# Patient Record
Sex: Male | Born: 1990 | Race: White | Hispanic: No | Marital: Married | Smoking: Never smoker
Health system: Southern US, Community
[De-identification: ages and names within clinical notes are randomized; demographics above are authoritative.]

## PROBLEM LIST (undated history)

## (undated) DIAGNOSIS — R319 Hematuria, unspecified: Secondary | ICD-10-CM

## (undated) DIAGNOSIS — L709 Acne, unspecified: Secondary | ICD-10-CM

## (undated) DIAGNOSIS — T7840XA Allergy, unspecified, initial encounter: Secondary | ICD-10-CM

## (undated) DIAGNOSIS — R748 Abnormal levels of other serum enzymes: Secondary | ICD-10-CM

## (undated) DIAGNOSIS — K219 Gastro-esophageal reflux disease without esophagitis: Secondary | ICD-10-CM

## (undated) DIAGNOSIS — R519 Headache, unspecified: Secondary | ICD-10-CM

## (undated) HISTORY — DX: Headache, unspecified: R51.9

## (undated) HISTORY — DX: Allergy, unspecified, initial encounter: T78.40XA

---
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
## 2010-12-09 ENCOUNTER — Ambulatory Visit (HOSPITAL_COMMUNITY)
Admission: RE | Admit: 2010-12-09 | Discharge: 2010-12-09 | Disposition: A | Discharge status: Home / Self Care | Payer: BC Managed Care – PPO | Source: Ambulatory Visit | Attending: Vascular Surgery | Admitting: Vascular Surgery

## 2010-12-09 DIAGNOSIS — M7989 Other specified soft tissue disorders: Secondary | ICD-10-CM

## 2011-09-13 HISTORY — PX: COLONOSCOPY: SHX174

## 2011-10-13 ENCOUNTER — Other Ambulatory Visit: Payer: Self-pay | Admitting: Urology

## 2011-10-20 ENCOUNTER — Other Ambulatory Visit (HOSPITAL_COMMUNITY): Payer: Self-pay | Admitting: Urology

## 2011-10-20 DIAGNOSIS — R3129 Other microscopic hematuria: Secondary | ICD-10-CM

## 2011-10-20 DIAGNOSIS — M545 Low back pain, unspecified: Secondary | ICD-10-CM

## 2011-10-25 ENCOUNTER — Other Ambulatory Visit (HOSPITAL_COMMUNITY): Payer: BC Managed Care – PPO

## 2011-10-27 NOTE — Progress Notes (Signed)
PT STATES POSSIBLE CANCEL OR RESCHEDULE DUE TO INSURANCE ISSUE, WILL KNOW BE Monday 10-30-2011 AFTERNOON. WILL CALL PT BACK THEN UNLESS THE CASE BEEN MOVED OR CANCELLED.

## 2011-10-30 ENCOUNTER — Other Ambulatory Visit (HOSPITAL_COMMUNITY): Payer: Self-pay | Admitting: Urology

## 2011-10-30 DIAGNOSIS — M545 Low back pain, unspecified: Secondary | ICD-10-CM

## 2011-10-31 ENCOUNTER — Encounter (HOSPITAL_BASED_OUTPATIENT_CLINIC_OR_DEPARTMENT_OTHER): Payer: Self-pay | Admitting: *Deleted

## 2011-10-31 NOTE — Progress Notes (Addendum)
NPO AFTER MN. ARRIVES AT 0830. NEEDS HG.  VERIFY NAME OF REFLUX MED.  , HE WILL TAKE AM OF SURG W/ SIP OF WATER.

## 2011-11-01 ENCOUNTER — Ambulatory Visit (HOSPITAL_COMMUNITY)
Admission: RE | Admit: 2011-11-01 | Discharge: 2011-11-01 | Disposition: A | Discharge status: Home / Self Care | Payer: 59 | Source: Ambulatory Visit | Attending: Urology | Admitting: Urology

## 2011-11-01 ENCOUNTER — Other Ambulatory Visit (HOSPITAL_COMMUNITY): Payer: BC Managed Care – PPO

## 2011-11-01 DIAGNOSIS — R3129 Other microscopic hematuria: Secondary | ICD-10-CM | POA: Insufficient documentation

## 2011-11-01 DIAGNOSIS — M545 Low back pain, unspecified: Secondary | ICD-10-CM | POA: Insufficient documentation

## 2011-11-01 MED ORDER — TECHNETIUM TC 99M MERTIATIDE
15.0000 | Freq: Once | INTRAVENOUS | Status: AC | PRN
  Administered 2011-11-01: 15 via INTRAVENOUS

## 2011-11-01 MED ORDER — FUROSEMIDE 10 MG/ML IJ SOLN
38.5000 mg | Freq: Once | INTRAMUSCULAR | Status: AC
  Administered 2011-11-01: 39 mg via INTRAVENOUS

## 2011-11-01 MED ORDER — FUROSEMIDE 10 MG/ML IJ SOLN
INTRAMUSCULAR | Status: AC
  Administered 2011-11-01: 39 mg via INTRAVENOUS
  Filled 2011-11-01: qty 4

## 2011-11-02 ENCOUNTER — Encounter (HOSPITAL_BASED_OUTPATIENT_CLINIC_OR_DEPARTMENT_OTHER)
Admission: RE | Disposition: A | Discharge status: Home / Self Care | Payer: Self-pay | Source: Ambulatory Visit | Attending: Urology

## 2011-11-02 ENCOUNTER — Ambulatory Visit (HOSPITAL_BASED_OUTPATIENT_CLINIC_OR_DEPARTMENT_OTHER): Payer: 59 | Admitting: Anesthesiology

## 2011-11-02 ENCOUNTER — Ambulatory Visit (HOSPITAL_BASED_OUTPATIENT_CLINIC_OR_DEPARTMENT_OTHER)
Admission: RE | Admit: 2011-11-02 | Discharge: 2011-11-02 | Disposition: A | Discharge status: Home / Self Care | Payer: 59 | Source: Ambulatory Visit | Attending: Urology | Admitting: Urology

## 2011-11-02 ENCOUNTER — Encounter (HOSPITAL_BASED_OUTPATIENT_CLINIC_OR_DEPARTMENT_OTHER): Payer: Self-pay | Admitting: *Deleted

## 2011-11-02 ENCOUNTER — Encounter (HOSPITAL_BASED_OUTPATIENT_CLINIC_OR_DEPARTMENT_OTHER): Payer: Self-pay | Admitting: Anesthesiology

## 2011-11-02 DIAGNOSIS — M545 Low back pain, unspecified: Secondary | ICD-10-CM | POA: Insufficient documentation

## 2011-11-02 DIAGNOSIS — R310 Gross hematuria: Secondary | ICD-10-CM | POA: Insufficient documentation

## 2011-11-02 DIAGNOSIS — Z79899 Other long term (current) drug therapy: Secondary | ICD-10-CM | POA: Insufficient documentation

## 2011-11-02 DIAGNOSIS — G8929 Other chronic pain: Secondary | ICD-10-CM | POA: Insufficient documentation

## 2011-11-02 DIAGNOSIS — K219 Gastro-esophageal reflux disease without esophagitis: Secondary | ICD-10-CM | POA: Insufficient documentation

## 2011-11-02 DIAGNOSIS — R319 Hematuria, unspecified: Secondary | ICD-10-CM

## 2011-11-02 HISTORY — DX: Acne, unspecified: L70.9

## 2011-11-02 HISTORY — PX: CYSTOSCOPY: SHX5120

## 2011-11-02 HISTORY — DX: Hematuria, unspecified: R31.9

## 2011-11-02 HISTORY — DX: Gastro-esophageal reflux disease without esophagitis: K21.9

## 2011-11-02 LAB — POCT HEMOGLOBIN-HEMACUE: Hemoglobin: 16.2 g/dL (ref 13.0–17.0)

## 2011-11-02 SURGERY — CYSTOSCOPY, FLEXIBLE
Anesthesia: General | Site: Bladder | Wound class: Clean Contaminated

## 2011-11-02 MED ORDER — LIDOCAINE HCL (CARDIAC) 20 MG/ML IV SOLN
INTRAVENOUS | Status: DC | PRN
  Administered 2011-11-02: 80 mg via INTRAVENOUS

## 2011-11-02 MED ORDER — MIDAZOLAM HCL 5 MG/5ML IJ SOLN
INTRAMUSCULAR | Status: DC | PRN
  Administered 2011-11-02: 1 mg via INTRAVENOUS

## 2011-11-02 MED ORDER — STERILE WATER FOR IRRIGATION IR SOLN
Status: DC | PRN
  Administered 2011-11-02: 3000 mL

## 2011-11-02 MED ORDER — PROPOFOL 10 MG/ML IV EMUL
INTRAVENOUS | Status: DC | PRN
  Administered 2011-11-02: 220 mg via INTRAVENOUS

## 2011-11-02 MED ORDER — PHENAZOPYRIDINE HCL 200 MG PO TABS: 200.0000 mg | ORAL_TABLET | Freq: Three times a day (TID) | ORAL | Status: AC | PRN

## 2011-11-02 MED ORDER — LIDOCAINE HCL 2 % EX GEL
CUTANEOUS | Status: DC | PRN
  Administered 2011-11-02: 1

## 2011-11-02 MED ORDER — LACTATED RINGERS IV SOLN
INTRAVENOUS | Status: DC
Start: 2011-11-02 — End: 2011-11-02
  Administered 2011-11-02 (×2): via INTRAVENOUS

## 2011-11-02 MED ORDER — DEXAMETHASONE SODIUM PHOSPHATE 4 MG/ML IJ SOLN
INTRAMUSCULAR | Status: DC | PRN
  Administered 2011-11-02: 10 mg via INTRAVENOUS

## 2011-11-02 MED ORDER — ONDANSETRON HCL 4 MG/2ML IJ SOLN
INTRAMUSCULAR | Status: DC | PRN
  Administered 2011-11-02: 4 mg via INTRAVENOUS

## 2011-11-02 MED ORDER — FENTANYL CITRATE 0.05 MG/ML IJ SOLN
INTRAMUSCULAR | Status: DC | PRN
  Administered 2011-11-02 (×2): 25 ug via INTRAVENOUS
  Administered 2011-11-02: 50 ug via INTRAVENOUS
  Administered 2011-11-02: 25 ug via INTRAVENOUS

## 2011-11-02 MED ORDER — CEFAZOLIN SODIUM 1-5 GM-% IV SOLN: 1.0000 g | INTRAVENOUS | Status: DC

## 2011-11-02 MED ORDER — FENTANYL CITRATE 0.05 MG/ML IJ SOLN: 25.0000 ug | INTRAMUSCULAR | Status: DC | PRN

## 2011-11-02 SURGICAL SUPPLY — 21 items
ADAPTER CATH URET PLST 4-6FR (CATHETERS) IMPLANT
BAG DRAIN URO-CYSTO SKYTR STRL (DRAIN) ×2 IMPLANT
BOOTIES KNEE HIGH SLOAN (MISCELLANEOUS) ×2 IMPLANT
CANISTER SUCT LVC 12 LTR MEDI- (MISCELLANEOUS) IMPLANT
CATH INTERMIT  6FR 70CM (CATHETERS) IMPLANT
CATH ROBINSON RED A/P 14FR (CATHETERS) ×2 IMPLANT
CATH URET 5FR 28IN CONE TIP (BALLOONS)
CATH URET 5FR 28IN OPEN ENDED (CATHETERS) IMPLANT
CATH URET 5FR 70CM CONE TIP (BALLOONS) IMPLANT
CLOTH BEACON ORANGE TIMEOUT ST (SAFETY) ×2 IMPLANT
DRAPE CAMERA CLOSED 9X96 (DRAPES) IMPLANT
GLOVE BIO SURGEON STRL SZ7 (GLOVE) ×2 IMPLANT
GLOVE BIO SURGEON STRL SZ7.5 (GLOVE) ×2 IMPLANT
GLOVE BIOGEL M STRL SZ7.5 (GLOVE) ×2 IMPLANT
GOWN STRL REIN XL XLG (GOWN DISPOSABLE) ×4 IMPLANT
GUIDEWIRE 0.038 PTFE COATED (WIRE) IMPLANT
GUIDEWIRE ANG ZIPWIRE 038X150 (WIRE) IMPLANT
GUIDEWIRE STR DUAL SENSOR (WIRE) IMPLANT
NS IRRIG 500ML POUR BTL (IV SOLUTION) IMPLANT
PACK CYSTOSCOPY (CUSTOM PROCEDURE TRAY) ×2 IMPLANT
WATER STERILE IRR 3000ML UROMA (IV SOLUTION) ×2 IMPLANT

## 2011-11-02 NOTE — Interval H&P Note (Signed)
History and Physical Interval Note:  11/02/2011 10:11 AM  Nathaniel Mann  has presented today for surgery, with the diagnosis of HEMATURRIA  The various methods of treatment have been discussed with the patient and family. After consideration of risks, benefits and other options for treatment, the patient has consented to  Procedure(s) (LRB): CYSTOSCOPY FLEXIBLE (N/A) as a surgical intervention .  The patient's history has been reviewed, patient examined, no change in status, stable for surgery.  I have reviewed the patients' chart and labs.  Questions were answered to the patient's satisfaction.     Jethro Bolus I

## 2011-11-02 NOTE — Anesthesia Preprocedure Evaluation (Signed)
Anesthesia Evaluation  Patient identified by MRN, date of birth, ID band Patient awake    Reviewed: Allergy & Precautions, H&P , NPO status , Patient's Chart, lab work & pertinent test results, reviewed documented beta blocker date and time   Airway Mallampati: I TM Distance: >3 FB Neck ROM: Full    Dental  (+) Teeth Intact and Dental Advisory Given   Pulmonary neg pulmonary ROS,  breath sounds clear to auscultation        Cardiovascular negative cardio ROS  Rhythm:Regular Rate:Normal     Neuro/Psych negative neurological ROS  negative psych ROS   GI/Hepatic negative GI ROS, Neg liver ROS,   Endo/Other  negative endocrine ROS  Renal/GU hematuria  negative genitourinary   Musculoskeletal negative musculoskeletal ROS (+)   Abdominal   Peds negative pediatric ROS (+)  Hematology negative hematology ROS (+)   Anesthesia Other Findings   Reproductive/Obstetrics negative OB ROS                           Anesthesia Physical Anesthesia Plan  ASA: I  Anesthesia Plan: General   Post-op Pain Management:    Induction: Intravenous  Airway Management Planned: LMA  Additional Equipment:   Intra-op Plan:   Post-operative Plan: Extubation in OR  Informed Consent: I have reviewed the patients History and Physical, chart, labs and discussed the procedure including the risks, benefits and alternatives for the proposed anesthesia with the patient or authorized representative who has indicated his/her understanding and acceptance.   Dental advisory given  Plan Discussed with: CRNA and Surgeon  Anesthesia Plan Comments:         Anesthesia Quick Evaluation

## 2011-11-02 NOTE — Anesthesia Procedure Notes (Signed)
Procedure Name: LMA Insertion Date/Time: 11/02/2011 10:15 AM Performed by: Fran Lowes Pre-anesthesia Checklist: Patient identified, Emergency Drugs available, Suction available and Patient being monitored Patient Re-evaluated:Patient Re-evaluated prior to inductionOxygen Delivery Method: Circle System Utilized Preoxygenation: Pre-oxygenation with 100% oxygen Intubation Type: IV induction Ventilation: Mask ventilation without difficulty LMA: LMA inserted LMA Size: 4.0 Number of attempts: 1 Airway Equipment and Method: bite block Placement Confirmation: positive ETCO2 Tube secured with: Tape Dental Injury: Teeth and Oropharynx as per pre-operative assessment  Comments: LMA inserted by Dr. Shireen Quan.

## 2011-11-02 NOTE — Transfer of Care (Signed)
Immediate Anesthesia Transfer of Care Note  Patient: Nathaniel Mann  Procedure(s) Performed: Procedure(s) (LRB): CYSTOSCOPY FLEXIBLE (N/A)  Patient Location: Patient transported to PACU with oxygen via face mask at 4 Liters / Min  Anesthesia Type: General  Level of Consciousness: awake and alert   Airway & Oxygen Therapy: Patient Spontanous Breathing and Patient connected to face mask oxygen  Post-op Assessment: Report given to PACU RN and Post -op Vital signs reviewed and stable  Post vital signs: Reviewed and stable  Dentition: Teeth and oropharynx remain in pre-op condition  Complications: No apparent anesthesia complications

## 2011-11-02 NOTE — Op Note (Signed)
Pre-operative diagnosis : Gross hematuria Postoperative diagnosis:same  Operation:Flexible cystoscopy  Surgeon:  S. Patsi Sears, MD  First assistant:none  Anesthesia:  general  Preparation: After appropriate preanesthesia, the patient was brought to the operating room, and placed upon the operating table in the dorsal supine position where general LMA anesthesia was introduced. The arm band was rechecked. The pubis was prepped with Betadine solution, and draped in the usual fashion.   Review history: The patient is a 21 year old male with a history of gross painless hematuria, post CT scan showing normal upper urinary tracts. He is now for flexible cystoscopy, which was unable to be performed in the office because of patient anxiety.  Statement of  Likelihood of Success: Excellent. TIME-OUT observed.:  Procedure: Flexural cystoscopy was accomplished, which shows a normal penis, which has been circumcised. The glans is normal. The penile urethral meatus is normal. There was no stricture disease. The distal urethra is normal, and the pendulous urethra is normal. The proximal urethra was normal. The ejaculatory ducts are patent and normal. The vera is normal. The proximal urethra is normal and the prostate lateral lobes are nonocclusive. The bladder neck is normal. The bladder itself shows a normal trigone, with clear reflux from both cortices. There is no evidence of trabeculation, no cellules. There is no stone or tumor formation. The bladder is drained of fluid, and Xylocaine jelly is placed in the urethra. The patient is then awakened, and taken to recovery room in good condition.

## 2011-11-02 NOTE — H&P (Signed)
f Complaint  cc: Dr. Thayer Headings   Reason For Visit  Microhematuria & chronic lower back pain   History of Present Illness          21 yo single male referred by Dr. Thea Silversmith for further evaluation of microhematuria & chronic lower back pain.  He has no problems voiding.  Drinks 1-2 alcohol beverages per week and 1 caffeinated drink per day.  Hx of smoking x 3 months, but quit 6yrs ago.  Family hx significant for a brother deceased at the age of 103 from colon cancer.    He saw Urologist in [place] while a [employer] for microhematuria, with CT showing normal. No cysto.   Past Medical History Problems  1. History of  Esophageal Reflux 530.81  Surgical History Problems  1. History of  Colonoscopy (Fiberoptic) 2. History of  No Surgical Problems  Current Meds 1. Doryx 150 MG Oral Tablet Delayed Release; Therapy: (Recorded:29May2013) to  Allergies Medication  1. Sulfa Drugs  Family History Problems  1. Fraternal history of  Blood In Urine 2. Fraternal history of  Colon Cancer V16.0 half brother  Social History Problems    Alcohol Use   Caffeine Use   Marital History - Single   Tobacco Use 305.1 at 21 smoked for 3 months.  Review of Systems Genitourinary, constitutional, skin, eye, otolaryngeal, hematologic/lymphatic, cardiovascular, pulmonary, endocrine, musculoskeletal, gastrointestinal, neurological and psychiatric system(s) were reviewed and pertinent findings if present are noted.  Genitourinary: hematuria and penile pain.  Integumentary: pruritus.  Musculoskeletal: back pain.    Vitals Vital Signs [Data Includes: Last 1 Day]  29May2013 10:56AM  BMI Calculated: 23.8 BSA Calculated: 1.97 Height: 5 ft 11 in Weight: 170 lb  Blood Pressure: 106 / 66 Temperature: 98.3 F Heart Rate: 101  Physical Exam Rectal: Rectal exam demonstrates normal sphincter tone, the anus is normal on inspection., no tenderness and no masses. Estimated prostate size is  2+. Normal rectal tone, no rectal masses, prostate is smooth, symmetric and non-tender. The prostate has no nodularity and is not tender. The left seminal vesicle is nonpalpable. The right seminal vesicle is nonpalpable. The perineum is normal on inspection.  Genitourinary: Examination of the penis demonstrates no discharge, no masses, no lesions and a normal meatus. The penis is circumcised. The scrotum is without lesions. The right epididymis is palpably normal and non-tender. The left epididymis is palpably normal and non-tender. The right testis is non-tender and without masses. The left testis is non-tender and without masses.    Results/Data Urine [Data Includes: Last 1 Day]   29May2013  COLOR YELLOW   APPEARANCE CLEAR   SPECIFIC GRAVITY 1.015   pH 6.5   GLUCOSE NEG mg/dL  BILIRUBIN NEG   KETONE NEG mg/dL  BLOOD TRACE   PROTEIN NEG mg/dL  UROBILINOGEN 0.2 mg/dL  NITRITE NEG   LEUKOCYTE ESTERASE NEG   SQUAMOUS EPITHELIAL/HPF FEW   WBC 0-3 WBC/hpf  RBC 0-3 RBC/hpf  BACTERIA RARE   CRYSTALS NONE SEEN   CASTS Hyaline casts noted    Assessment Assessed  1. Microscopic Hematuria 599.72   21 yo male with chronic low back pain, but normal low back pain. He has pain post orgasm and ejaculation. He has some hyaline casts, and will have a cr/gfr evaluation, CT and cysto.   Plan Lower Back Pain Chronic  1. MISC NUCLEAR MEDICINE  Requested for: 29May2013 Microscopic Hematuria (599.72)  2. AU CT-HEMATURIA PROTOCOL  Requested for: 29May2013 3. CREATININE with eGFR  Requested for: 29May2013 4. KUB  Requested for: 29May2013 5. Follow-up Schedule Surgery Office  Follow-up  Requested for: 29May2013      1. cr/gfr 2. Ct Hematuria evaluation 3. CT anesthesia evaluation  4. Will ck a Lasix renogram for chronic back pain post drinking sodas.   Signatures Electronically signed by : Jethro Bolus, M.D.; Oct 11 2011 11:54AM

## 2011-11-02 NOTE — Discharge Instructions (Addendum)
Hematuria, Adult Hematuria (blood in your urine) can be caused by a bladder infection (cystitis), kidney infection (pyelonephritis), prostate infection (prostatitis), or kidney stone. Infections will usually respond to antibiotics (medications which kill germs), and a kidney stone will usually pass through your urine without further treatment. If you were put on antibiotics, take all the medicine until gone. You may feel better in a few days, but take all of your medicine or the infection may not respond and become more difficult to treat. If antibiotics were not given, an infection did not cause the blood in the urine. A further work up to find out the reason may be needed. HOME CARE INSTRUCTIONS   Drink lots of fluid, 3 to 4 quarts a day. If you have been diagnosed with an infection, cranberry juice is especially recommended, in addition to large amounts of water.   Avoid caffeine, tea, and carbonated beverages, because they tend to irritate the bladder.   Avoid alcohol as it may irritate the prostate.   Only take over-the-counter or prescription medicines for pain, discomfort, or fever as directed by your caregiver.   If you have been diagnosed with a kidney stone follow your caregivers instructions regarding straining your urine to catch the stone.  TO PREVENT FURTHER INFECTIONS:  Empty the bladder often. Avoid holding urine for long periods of time.   After a bowel movement, women should cleanse front to back. Use each tissue only once.   Empty the bladder before and after sexual intercourse if you are a male.   Return to your caregiver if you develop back pain, fever, nausea (feeling sick to your stomach), vomiting, or your symptoms (problems) are not better in 3 days. Return sooner if you are getting worse.  If you have been requested to return for further testing make sure to keep your appointments. If an infection is not the cause of blood in your urine, X-rays may be required. Your  caregiver will discuss this with you. SEEK IMMEDIATE MEDICAL CARE IF:   You have a persistent fever over 102 F (38.9 C).   You develop severe vomiting and are unable to keep the medication down.   You develop severe back or abdominal pain despite taking your medications.   You begin passing a large amount of blood or clots in your urine.   You feel extremely weak or faint, or pass out.  MAKE SURE YOU:   Understand these instructions.   Will watch your condition.   Will get help right away if you are not doing well or get worse.  Document Released: 05/01/2005 Document Revised: 04/20/2011 Document Reviewed: 12/19/2007 ExitCare Patient Information 2012 [place], [facility] Post Anesthesia Home Care Instructions  Activity: Get plenty of rest for the remainder of the day. A responsible adult should stay with you for 24 hours following the procedure.  For the next 24 hours, DO NOT: -Drive a car -Advertising copywriter -Drink alcoholic beverages -Take any medication unless instructed by your physician -Make any legal decisions or sign important papers.  Meals: Start with liquid foods such as gelatin or soup. Progress to regular foods as tolerated. Avoid greasy, spicy, heavy foods. If nausea and/or vomiting occur, drink only clear liquids until the nausea and/or vomiting subsides. Call your physician if vomiting continues.  Special Instructions/Symptoms: Your throat may feel dry or sore from the anesthesia or the breathing tube placed in your throat during surgery. If this causes discomfort, gargle with warm salt water. The discomfort should disappear within 24  hours.  Marland Kitchen

## 2011-11-02 NOTE — Progress Notes (Signed)
Patient is on dexilant  Acid reflux med  2-3 weeks last taken

## 2011-11-03 ENCOUNTER — Encounter (HOSPITAL_BASED_OUTPATIENT_CLINIC_OR_DEPARTMENT_OTHER): Payer: Self-pay | Admitting: Urology

## 2011-11-03 NOTE — Anesthesia Postprocedure Evaluation (Signed)
  Anesthesia Post-op Note  Patient: Nathaniel Mann  Procedure(s) Performed: Procedure(s) (LRB): CYSTOSCOPY FLEXIBLE (N/A)  Patient Location: PACU  Anesthesia Type: General  Level of Consciousness: oriented and sedated  Airway and Oxygen Therapy: Patient Spontanous Breathing  Post-op Pain: mild  Post-op Assessment: Post-op Vital signs reviewed, Patient's Cardiovascular Status Stable, Respiratory Function Stable and Patent Airway  Post-op Vital Signs: stable  Complications: No apparent anesthesia complications

## 2012-05-21 ENCOUNTER — Other Ambulatory Visit: Payer: Self-pay | Admitting: *Deleted

## 2012-05-21 DIAGNOSIS — I83893 Varicose veins of bilateral lower extremities with other complications: Secondary | ICD-10-CM

## 2012-06-27 ENCOUNTER — Encounter: Payer: 59 | Admitting: Vascular Surgery

## 2012-07-18 ENCOUNTER — Encounter: Payer: Self-pay | Admitting: Vascular Surgery

## 2012-07-19 ENCOUNTER — Encounter: Payer: 59 | Admitting: Vascular Surgery

## 2012-08-13 DIAGNOSIS — T7840XA Allergy, unspecified, initial encounter: Secondary | ICD-10-CM

## 2012-08-13 HISTORY — DX: Allergy, unspecified, initial encounter: T78.40XA

## 2012-08-15 ENCOUNTER — Encounter: Payer: Self-pay | Admitting: Vascular Surgery

## 2012-08-16 ENCOUNTER — Ambulatory Visit (INDEPENDENT_AMBULATORY_CARE_PROVIDER_SITE_OTHER): Payer: 59 | Admitting: Vascular Surgery

## 2012-08-16 ENCOUNTER — Encounter: Payer: Self-pay | Admitting: Vascular Surgery

## 2012-08-16 ENCOUNTER — Encounter (INDEPENDENT_AMBULATORY_CARE_PROVIDER_SITE_OTHER): Payer: 59 | Admitting: *Deleted

## 2012-08-16 VITALS — BP 117/73 | HR 95 | Resp 16 | Ht 71.0 in | Wt 174.0 lb

## 2012-08-16 DIAGNOSIS — M79609 Pain in unspecified limb: Secondary | ICD-10-CM | POA: Insufficient documentation

## 2012-08-16 DIAGNOSIS — I83893 Varicose veins of bilateral lower extremities with other complications: Secondary | ICD-10-CM

## 2012-08-16 NOTE — Progress Notes (Signed)
[facility]  Referred by:  Thayer Headings, MD [address] [place], [place] [address]  Reason for referral: Right popliteal fossa varicosities  History of Present Illness  Nathaniel Mann is a 22 y.o. (24-Feb-1991) male who presents with chief complaint: painful veins in right popliteal fossa.  Patient notes, onset of bilateral knee pain years ago without obvious trigger.  Pain in left knee has subside. The pain is achy in character with mild to moderate intensity.  The patient attributes the pain to veins behind his knees.  The patient has had no history of DVT, no history of varicose vein, no history of venous stasis ulcers, no history of  Lymphedema and no history of skin changes in lower legs.  There is a family history of varicose veins.  The patient has never used compression stockings in the past.  The patient denies significant leg swelling, pruritus or burst sensation.    Past Medical History  Diagnosis Date  . Hematuria   . Acne   . GERD (gastroesophageal reflux disease)   . Allergy April 2014    Rag weed    Past Surgical History  Procedure Laterality Date  . 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 Cystoscopy  11/02/2011    Procedure: CYSTOSCOPY FLEXIBLE;  Surgeon: Kathi Ludwig, MD;  Location: [facility];  Service: Urology;  Laterality: N/A;  IV SEDATION    History   Social History  . Marital Status: Single    Spouse Name: N/A    Number of Children: N/A  . Years of Education: N/A   Occupational History  . Not on file.   Social History Main Topics  . Smoking status: Never Smoker   . Smokeless tobacco: Never Used  . Alcohol Use: Yes     Comment: SOCIAL  . Drug Use: No  . Sexually Active: Not on file   Other Topics Concern  . Not on file   Social History Narrative  . No narrative on file    Family History  Problem Relation Age of Onset   . Cancer Brother     Colon  . Deep vein thrombosis Mother     Current Outpatient Prescriptions on File Prior to Visit  Medication Sig Dispense Refill  . Omeprazole (PRILOSEC PO) Take by mouth as needed. Name of reflux med needs to be verified.      . Doxycycline Hyclate (DORYX PO) Take 1 tablet by mouth daily.       No current facility-administered medications on file prior to visit.    Allergies  Allergen Reactions  . Sulfa Antibiotics Rash    REVIEW OF SYSTEMS:  (Positives checked otherwise negative)  CARDIOVASCULAR:  [ ]  chest pain, [ ]  chest pressure, [ ]  palpitations, [ ]  shortness of breath when laying flat, [ ]  shortness of breath with exertion,   [x]  pain in feet when walking, [x]  pain in feet when laying flat, [ ]  history of blood clot in veins (DVT), [ ]  history of phlebitis, [ ]  swelling in legs, [x]  varicose veins  PULMONARY:  [ ]  productive cough, [ ]  asthma, [ ]  wheezing  NEUROLOGIC:  [ ]  weakness in arms or legs, [ ]  numbness in arms or legs, [ ]  difficulty speaking or slurred speech, [ ]  temporary loss of vision in one eye, [ ]  dizziness  HEMATOLOGIC:  [ ]  bleeding problems, [ ]   problems with blood clotting too easily  MUSCULOSKEL:  [ ]  joint pain, [ ]  joint swelling  GASTROINTEST:  [ ]   Vomiting blood, [ ]   Blood in stool     GENITOURINARY:  [ ]   Burning with urination, [ ]   Blood in urine  PSYCHIATRIC:  [ ]  history of major depression  INTEGUMENTARY:  [ ]  rashes, [ ]  ulcers  CONSTITUTIONAL:  [ ]  fever, [ ]  chills  Physical Examination  Filed Vitals:   08/16/12 1423  BP: 117/73  Pulse: 95  Resp: 16  Height: 5\' 11"  (1.803 m)  Weight: 174 lb (78.926 kg)  SpO2: 100%   Body mass index is 24.28 kg/(m^2).  General: A&O x 3, WDWN  Head: East /AT  Ear/Nose/Throat: Hearing grossly intact, nares w/o erythema or drainage, oropharynx w/o Erythema/Exudate  Eyes: PERRLA, EOMI  Neck: Supple, no nuchal rigidity, no palpable LAD  Pulmonary: Sym exp, good  air movt, CTAB, no rales, rhonchi, & wheezing  Cardiac: RRR, Nl S1, S2, no Murmurs, rubs or gallops  Vascular: Vessel Right Left  Radial Palpable Palpable  Ulnar Palpable Palpable  Brachial Palpable Palpable  Carotid Palpable, without bruit Palpable, without bruit  Aorta Not palpable N/A  Femoral Palpable Palpable  Popliteal Not palpable Not palpable  PT Palpable Palpable  DP Palpable Palpable   Gastrointestinal: soft, NTND, -G/R, - HSM, - masses, - CVAT B  Musculoskeletal: M/S 5/5 throughout , Extremities without ischemic changes   Neurologic: CN 2-12 intact , Pain and light touch intact in extremities , Motor exam as listed above  Psychiatric: Judgment intact, Mood & affect appropriate for pt's clinical situation  Dermatologic: See M/S exam for extremity exam, no rashes otherwise noted  Lymph : No Cervical, Axillary, or Inguinal lymphadenopathy   Non-Invasive Vascular Imaging  BLE Venous Insufficiency Duplex (Date: 08/16/12):   RLE: No DVT or SVT, CFV reflux, no GSV reflux  LLE: No DVT or SVT, CFV reflux, no GSV reflux  Outside Studies/Documentation 2 pages of outside documents were reviewed including: outpatient chart.  Medical Decision Making  Nathaniel Mann is a 22 y.o. male who presents with: Chronic venous insufficiency (C2), B knee pain (R>L).   The pt's sx are not consistent with chronic venous insufficiency, but as these sx can be atypical I would not absolutely rule out CVI as the etiology.  Based on the patient's history and examination, I recommend: trial of thigh high compression stockings 20-30 mm Hg.  If the compression stocking fail to resolve his sx, further work-up including orthopedic consultation may be needed.    At this age, there are very few things from a vascular standpoint that could account for his findings.  His sx are not consistent with popliteal arterial entrapment syndrome.  Thank you for allowing [place] to participate in this patient's  care.  The patient can follow up with [place] as needed.  Leonides Sake, MD [facility] Office: [phone] Pager: [phone]  08/16/2012, 2:50 PM

## 2016-05-13 ENCOUNTER — Emergency Department (HOSPITAL_COMMUNITY): Payer: 59

## 2016-05-13 ENCOUNTER — Emergency Department (HOSPITAL_COMMUNITY)
Admission: EM | Admit: 2016-05-13 | Discharge: 2016-05-14 | Disposition: A | Discharge status: Home / Self Care | Payer: 59 | Attending: Emergency Medicine | Admitting: Emergency Medicine

## 2016-05-13 ENCOUNTER — Encounter (HOSPITAL_COMMUNITY): Payer: Self-pay | Admitting: *Deleted

## 2016-05-13 DIAGNOSIS — M549 Dorsalgia, unspecified: Secondary | ICD-10-CM | POA: Diagnosis not present

## 2016-05-13 DIAGNOSIS — R300 Dysuria: Secondary | ICD-10-CM | POA: Diagnosis not present

## 2016-05-13 DIAGNOSIS — R112 Nausea with vomiting, unspecified: Secondary | ICD-10-CM | POA: Diagnosis not present

## 2016-05-13 DIAGNOSIS — R509 Fever, unspecified: Secondary | ICD-10-CM | POA: Diagnosis not present

## 2016-05-13 DIAGNOSIS — R197 Diarrhea, unspecified: Secondary | ICD-10-CM | POA: Diagnosis not present

## 2016-05-13 DIAGNOSIS — R319 Hematuria, unspecified: Secondary | ICD-10-CM

## 2016-05-13 DIAGNOSIS — R1011 Right upper quadrant pain: Secondary | ICD-10-CM | POA: Diagnosis not present

## 2016-05-13 DIAGNOSIS — M542 Cervicalgia: Secondary | ICD-10-CM | POA: Insufficient documentation

## 2016-05-13 HISTORY — DX: Abnormal levels of other serum enzymes: R74.8

## 2016-05-13 LAB — CBC WITH DIFFERENTIAL/PLATELET
Basophils Absolute: 0 10*3/uL (ref 0.0–0.1)
Basophils Relative: 0 %
Eosinophils Absolute: 0 10*3/uL (ref 0.0–0.7)
Eosinophils Relative: 1 %
HCT: 47.3 % (ref 39.0–52.0)
Hemoglobin: 16.3 g/dL (ref 13.0–17.0)
Lymphocytes Relative: 7 %
Lymphs Abs: 0.5 10*3/uL — ABNORMAL LOW (ref 0.7–4.0)
MCH: 31.2 pg (ref 26.0–34.0)
MCHC: 34.5 g/dL (ref 30.0–36.0)
MCV: 90.6 fL (ref 78.0–100.0)
Monocytes Absolute: 0.7 10*3/uL (ref 0.1–1.0)
Monocytes Relative: 10 %
Neutro Abs: 5.8 10*3/uL (ref 1.7–7.7)
Neutrophils Relative %: 82 %
Platelets: 155 10*3/uL (ref 150–400)
RBC: 5.22 MIL/uL (ref 4.22–5.81)
RDW: 12 % (ref 11.5–15.5)
WBC: 7 10*3/uL (ref 4.0–10.5)

## 2016-05-13 LAB — COMPREHENSIVE METABOLIC PANEL
ALT: 19 U/L (ref 17–63)
AST: 20 U/L (ref 15–41)
Albumin: 4.1 g/dL (ref 3.5–5.0)
Alkaline Phosphatase: 36 U/L — ABNORMAL LOW (ref 38–126)
Anion gap: 9 (ref 5–15)
BUN: 14 mg/dL (ref 6–20)
CO2: 23 mmol/L (ref 22–32)
Calcium: 8.7 mg/dL — ABNORMAL LOW (ref 8.9–10.3)
Chloride: 100 mmol/L — ABNORMAL LOW (ref 101–111)
Creatinine, Ser: 0.88 mg/dL (ref 0.61–1.24)
GFR calc Af Amer: >60 mL/min (ref >60)
GFR calc non Af Amer: >60 mL/min (ref >60)
Glucose, Bld: 108 mg/dL — ABNORMAL HIGH (ref 65–99)
Potassium: 3.1 mmol/L — ABNORMAL LOW (ref 3.5–5.1)
Sodium: 132 mmol/L — ABNORMAL LOW (ref 135–145)
Total Bilirubin: 2.5 mg/dL — ABNORMAL HIGH (ref 0.3–1.2)
Total Protein: 7.1 g/dL (ref 6.5–8.1)

## 2016-05-13 LAB — LIPASE, BLOOD: Lipase: 27 U/L (ref 11–51)

## 2016-05-13 MED ORDER — HYDROMORPHONE HCL 1 MG/ML IJ SOLN
0.5000 mg | Freq: Once | INTRAMUSCULAR | Status: DC
  Filled 2016-05-13: qty 1

## 2016-05-13 MED ORDER — ONDANSETRON HCL 4 MG/2ML IJ SOLN
4.0000 mg | Freq: Once | INTRAMUSCULAR | Status: AC
  Administered 2016-05-13: 4 mg via INTRAVENOUS
  Filled 2016-05-13: qty 2

## 2016-05-13 MED ORDER — IOPAMIDOL (ISOVUE-300) INJECTION 61%
INTRAVENOUS | Status: AC
  Filled 2016-05-13: qty 30

## 2016-05-13 MED ORDER — SODIUM CHLORIDE 0.9 % IV BOLUS (SEPSIS)
2000.0000 mL | Freq: Once | INTRAVENOUS | Status: AC
  Administered 2016-05-13: 2000 mL via INTRAVENOUS

## 2016-05-13 MED ORDER — IOPAMIDOL (ISOVUE-300) INJECTION 61%
100.0000 mL | Freq: Once | INTRAVENOUS | Status: AC | PRN
  Administered 2016-05-13: 100 mL via INTRAVENOUS

## 2016-05-13 MED ORDER — HYDROMORPHONE HCL 1 MG/ML IJ SOLN: 1.0000 mg | Freq: Once | INTRAMUSCULAR | Status: DC

## 2016-05-13 NOTE — ED Notes (Signed)
Reports pain and nausea abating

## 2016-05-13 NOTE — ED Triage Notes (Signed)
Pt reports n/v from 5 am -2 pm yesterday once every hour. Pt reports intermittent n/v/d today. Pt reports ruq pain and fever of 101.0. Pt light headed in triage.

## 2016-05-13 NOTE — ED Notes (Signed)
Dr Mesner in to assess 

## 2016-05-13 NOTE — ED Provider Notes (Signed)
[facility]-EMERGENCY DEPT Provider Note   CSN: [id number] Arrival date & time: 05/13/16  2152    By signing my name below, I, Valentino SaxonBianca Contreras, attest that this documentation has been prepared under the direction and in the presence of Marily MemosJason Mesner, MD. Electronically Signed: Valentino SaxonBianca Contreras, ED Scribe. 05/13/16. 10:39 PM.  History   Chief Complaint Chief Complaint  Patient presents with  . Abdominal Pain   The history is provided by the patient and the spouse. No language interpreter was used.   HPI Comments: Nathaniel Mann is a 25 y.o. male with PMHx of GERD, elevated liver enzymes and hematuria, who presents to the Emergency Department complaining of moderate, intermittent, RUQ abdominal pain onset yesterday morning. Pt reports associated intermittent nausea, vomiting and dry heaving accompanied by a fever. Per spouse, she notes taking Pt's temperature at home and reports temp of 101. Pt's temperature in the ED today was 98.2.  Pt states he had generalized abdominal pain yesterday but started having RUQ pain today. He describes his abdominal pain as a stabbing sensation. He notes his abdominal pain is worsened with pressure, deep breaths and consumption of alcohol or acidic drinks, such as soda. He notes waking up at 5:30am yesterday with episodes of diarrhea. Pt notes his abdominal pain was "violent" until ~2pm yesterday. He states being able to drink some water and coke today with minimal difficulty. Pt reports he began feeling better today but notes having episodes of diarrhea throughout the day. He describes his diarrhea as a dark/ brown color. He took imodium and Pepto Bismol with minimal relief. States eating a burger, fries and hot dogs followed by some alcoholic beverages yesterday. Today, pt ate some salad and pizza.  He also notes dysuria. He describes his vomit is clear. Per spouse, he reports neck pain that radiates to his shoulders and back. No alleviating factors noted for neck,  shoulder and back pain. Denies cough. Denies hx of kidney stone. He also denies drug use.  Past Medical History:  Diagnosis Date  . Acne   . Allergy April 2014   Rag weed  . Elevated liver enzymes   . GERD (gastroesophageal reflux disease)   . Hematuria     Patient Active Problem List   Diagnosis Date Noted  . Varicose veins of lower extremities with other complications 08/16/2012  . Pain in limb 08/16/2012    Past Surgical History:  Procedure Laterality Date  . COLONOSCOPY  MAY 2013   NEGATIVE FINDINGS--  BROTHER DECEASED AT AGE 42 FROM COLON CANCER  . CYSTOSCOPY  11/02/2011   Procedure: CYSTOSCOPY FLEXIBLE;  Surgeon: Kathi LudwigSigmund I Tannenbaum, MD;  [facility] [place];  Service: Urology;  Laterality: N/A;  IV SEDATION       Home Medications    Prior to Admission medications   Medication Sig Start Date End Date Taking? Authorizing Provider  acetaminophen (TYLENOL) 650 MG CR tablet Take 650 mg by mouth every 8 (eight) hours as needed for pain.   Yes Historical Provider, MD  ondansetron (ZOFRAN) 4 mg TABS tablet Take 4 tablets by mouth every 8 (eight) hours as needed. 05/14/16   Marily MemosJason Mesner, MD    Family History Family History  Problem Relation Age of Onset  . Cancer Brother     Colon  . Deep vein thrombosis Mother     Social History Social History  Substance Use Topics  . Smoking status: Never Smoker  . Smokeless tobacco: Never Used  . Alcohol use Yes  Comment: SOCIAL     Allergies   Latex and Sulfa antibiotics   Review of Systems Review of Systems  Constitutional: Positive for fever.  Respiratory: Negative for cough.   Gastrointestinal: Positive for abdominal pain, diarrhea, nausea and vomiting.  Genitourinary: Positive for dysuria.  Musculoskeletal: Positive for arthralgias, back pain and neck pain.     Physical Exam Updated Vital Signs BP 116/72   Pulse 87   Temp 98.2 F (36.8 C) (Oral)   Resp 16   SpO2 100%   Physical  Exam  Constitutional: He appears well-developed and well-nourished. No distress.  Appears to be not feeling well. No distress.   HENT:  Head: Normocephalic and atraumatic.  Eyes: Conjunctivae are normal. Right eye exhibits no discharge. Left eye exhibits no discharge.  Neck:  No midline tenderness in neck. No pain with ROM in neck.  Cardiovascular:  Tachycardiac  Pulmonary/Chest: Effort normal. No respiratory distress.  Abdominal: There is tenderness. There is no rebound and no guarding.  RUQ tenderness, no rebound or guarding.   Musculoskeletal: He exhibits no tenderness.  No CVA tenderness.  Neurological: He is alert. Coordination normal.  Skin: Skin is warm and dry. No rash noted. He is not diaphoretic. No erythema.  Psychiatric: He has a normal mood and affect.  Nursing note and vitals reviewed.    ED Treatments / Results   DIAGNOSTIC STUDIES: Oxygen Saturation is 98% on RA, normal by my interpretation.    COORDINATION OF CARE: 10:25 PM Discussed treatment plan with pt and pt's spouse at bedside which includes imaging (CT), labs and pain medication and pt agreed to plan.   Labs (all labs ordered are listed, but only abnormal results are displayed) Labs Reviewed  CBC WITH DIFFERENTIAL/PLATELET - Abnormal; Notable for the following:       Result Value   Lymphs Abs 0.5 (*)    All other components within normal limits  COMPREHENSIVE METABOLIC PANEL - Abnormal; Notable for the following:    Sodium 132 (*)    Potassium 3.1 (*)    Chloride 100 (*)    Glucose, Bld 108 (*)    Calcium 8.7 (*)    Alkaline Phosphatase 36 (*)    Total Bilirubin 2.5 (*)    All other components within normal limits  URINALYSIS, ROUTINE W REFLEX MICROSCOPIC - Abnormal; Notable for the following:    Specific Gravity, Urine 1.034 (*)    Hgb urine dipstick LARGE (*)    Ketones, ur 5 (*)    Protein, ur 30 (*)    All other components within normal limits  LIPASE, BLOOD    EKG  EKG  Interpretation None       Radiology Ct Abdomen Pelvis W Contrast  Result Date: 05/13/2016 CLINICAL DATA:  Patient with right upper quadrant abdominal pain, nausea and vomiting. EXAM: CT ABDOMEN AND PELVIS WITH CONTRAST TECHNIQUE: Multidetector CT imaging of the abdomen and pelvis was performed using the standard protocol following bolus administration of intravenous contrast. CONTRAST:  100mL ISOVUE-300 IOPAMIDOL (ISOVUE-300) INJECTION 61% COMPARISON:  CT abdomen pelvis 10/17/2011. FINDINGS: Lower chest: Normal heart size. Lower lobes are clear. No pleural effusion. Hepatobiliary: Liver is normal in size and contour. No focal hepatic lesion is identified. Gallbladder is unremarkable. Pancreas: Unremarkable Spleen: Prominent measuring 13.5 cm. Adrenals/Urinary Tract: The adrenal glands are normal. Kidneys enhance symmetrically with contrast. No hydronephrosis. Urinary bladder is unremarkable. Stomach/Bowel: Stomach is within normal limits. Appendix appears normal. No evidence of bowel wall thickening, distention, or inflammatory changes.  Vascular/Lymphatic: Normal caliber abdominal aorta. No retroperitoneal lymphadenopathy. Reproductive: Prostate is unremarkable. Other: None. Musculoskeletal: No aggressive or acute appearing osseous lesions. IMPRESSION: No acute process within the abdomen or pelvis. Mild splenomegaly. Electronically Signed   By: Annia Belt M.D.   On: 05/13/2016 23:38    Procedures Procedures (including critical care time)  Medications Ordered in ED Medications  sodium chloride 0.9 % bolus 2,000 mL (0 mLs Intravenous Stopped 05/14/16 0053)  ondansetron (ZOFRAN) injection 4 mg (4 mg Intravenous Given 05/13/16 2253)  iopamidol (ISOVUE-300) 61 % injection 100 mL (100 mLs Intravenous Contrast Given 05/13/16 2319)     Initial Impression / Assessment and Plan / ED Course  I have reviewed the triage vital signs and the nursing notes.  Pertinent labs & imaging results that were  available during my care of the patient were reviewed by me and considered in my medical decision making (see chart for details).  Clinical Course     Initial concern for possible gall bladder pathology however wholly unremarkable on CT scan. 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 Rest of intra-abdominal structures appear well. He also has some hematuria which is also not new for him. He has had multiple workups for this and has been negative. His symptoms are improved his tolerating by mouth while in the emergency department. This very well could just be gastroenteritis is no evidence enteritis on the CT scan. I will treat with antiemetics and have him reTurn here in one day if not improving.  Final Clinical Impressions(s) / ED Diagnoses   Final diagnoses:  Right upper quadrant abdominal pain  Hematuria, unspecified type  Non-intractable vomiting with nausea, unspecified vomiting type    New Prescriptions Discharge Medication List as of 05/14/2016 12:17 AM    START taking these medications   Details  ondansetron (ZOFRAN) 4 mg TABS tablet Take 4 tablets by mouth every 8 (eight) hours as needed., Starting Sun 05/14/2016, Print        I personally performed the services described in this documentation, which was scribed in my presence. The recorded information has been reviewed and is accurate.      Marily Memos, MD 05/14/16 [phone]

## 2016-05-13 NOTE — ED Notes (Signed)
Patient transported to CT 

## 2016-05-13 NOTE — ED Notes (Signed)
In speaking with pt, he reports that his brother died of colon cancer at age 26 Dr Mesner informed

## 2016-05-14 LAB — URINALYSIS, ROUTINE W REFLEX MICROSCOPIC
Bacteria, UA: NONE SEEN
Bilirubin Urine: NEGATIVE
Glucose, UA: NEGATIVE mg/dL
Ketones, ur: 5 mg/dL — AB
Leukocytes, UA: NEGATIVE
Nitrite: NEGATIVE
Protein, ur: 30 mg/dL — AB
Specific Gravity, Urine: 1.034 — ABNORMAL HIGH (ref 1.005–1.030)
pH: 6 (ref 5.0–8.0)

## 2016-05-14 MED ORDER — ONDANSETRON 4 MG PREPACK (~~LOC~~): 1.0000 | ORAL_TABLET | Freq: Three times a day (TID) | ORAL | 0 refills | Status: DC | PRN

## 2016-05-17 MED FILL — Ondansetron HCl Tab 4 MG: ORAL | Qty: 4 | Status: AC

## 2019-10-09 ENCOUNTER — Ambulatory Visit: Payer: 59 | Admitting: Sports Medicine

## 2019-10-16 ENCOUNTER — Ambulatory Visit: Payer: 59 | Admitting: Sports Medicine

## 2019-12-04 ENCOUNTER — Other Ambulatory Visit: Payer: Self-pay

## 2019-12-04 ENCOUNTER — Ambulatory Visit: Payer: Managed Care, Other (non HMO) | Attending: Urology | Admitting: Physical Therapy

## 2019-12-04 DIAGNOSIS — R279 Unspecified lack of coordination: Secondary | ICD-10-CM | POA: Diagnosis present

## 2019-12-04 DIAGNOSIS — R252 Cramp and spasm: Secondary | ICD-10-CM | POA: Insufficient documentation

## 2019-12-04 NOTE — Therapy (Addendum)
[facility] [address] [place], [place], [address] Phone: [phone]   Fax:  [phone]  Physical Therapy Evaluation  Patient Details  Name: Nathaniel Mann MRN: [id number] Date of Birth: 07-22-1990 Referring Provider (PT): Crista Elliot, MD   Encounter Date: 12/04/2019   PT End of Session - 12/04/19 1751    Visit Number 1    Date for PT Re-Evaluation 02/26/20    PT Start Time 0800    PT Stop Time 0843    PT Time Calculation (min) 43 min    Activity Tolerance Patient tolerated treatment well    Behavior During Therapy [facility] for tasks assessed/performed           Past Medical History:  Diagnosis Date  . Acne   . Allergy April 2014   Rag weed  . Elevated liver enzymes   . GERD (gastroesophageal reflux disease)   . Hematuria     Past Surgical History:  Procedure Laterality Date  . COLONOSCOPY  MAY 2013   NEGATIVE FINDINGS--  BROTHER DECEASED AT AGE 72 FROM COLON CANCER  . CYSTOSCOPY  11/02/2011   Procedure: CYSTOSCOPY FLEXIBLE;  Surgeon: Kathi Ludwig, MD;  Location: [facility];  Service: Urology;  Laterality: N/A;  IV SEDATION    There were no vitals filed for this visit.    Subjective Assessment - 12/04/19 0801    Subjective Probably ever since 29 y/o had on and off prostate infections.  Has had anal leakage and hard to empty bladder    Limitations Other (comment)    Diagnostic tests [place] and MRI of prostate    Patient Stated Goals be able to have pain free intercourse   Currently in Pain? Yes    Pain Score 2    6-7/10   Pain Location Perineum    Pain Orientation Left    Pain Descriptors / Indicators Nagging;Dull;Sharp    Pain Type Chronic pain    Pain Radiating Towards perineal body back to rectum and shoots into the scrotum after intercourse    Pain Frequency Intermittent    Aggravating Factors  intercourse and worse if didn't have an orgasm    Pain Relieving  Factors goes away after a few hours    Multiple Pain Sites No              [facility] PT Assessment - 12/08/19 0001      Assessment   Medical Diagnosis R10.2 (ICD-10-CM) - Pelvic and perineal pain    Referring Provider (PT) Crista Elliot, MD    Prior Therapy No      Precautions   Precautions None      Balance Screen   Has the patient fallen in the past 6 months No      Home Environment   Living Environment Private residence    Living Arrangements Spouse/significant other      Prior Function   Level of Independence Independent    Vocation Full time employment    Vocation Requirements sitting at computer      Cognition   Overall Cognitive Status Within Functional Limits for tasks assessed      Observation/Other Assessments   Observations sitting in slouched posture decreased lumbar lordosis      ROM / Strength   AROM / PROM / Strength AROM;PROM;Strength      AROM   Overall AROM Comments lumbar ext 25% limited      Strength   Overall  Strength Comments hip abduction and adduction 4+/5 bil      Flexibility   Soft Tissue Assessment /Muscle Length yes    Hamstrings normal      Special Tests   Other special tests ASLR a little easier with compression      Ambulation/Gait   Gait Pattern Within Functional Limits                      Objective measurements completed on examination: See above findings.     Pelvic Floor Special Questions - 12/08/19 0001    Skin Integrity Intact    Pelvic Floor Internal Exam pt informed and identity confirmed for internal soft tissue assessment    Exam Type Rectal    Palpation high tone and difficulty relaxing    Strength weak squeeze, no lift    Strength # of reps --   2.5 in 10 sec   Strength # of seconds 5    Tone high                    PT Education - 12/04/19 0838    Education Details Access Code: [email or web address]    Person(s) Educated Patient    Methods Explanation;Demonstration;Handout;Verbal cues     Comprehension Verbalized understanding;Returned demonstration            PT Short Term Goals - 12/08/19 1228      PT SHORT TERM GOAL #1   Title Pt will demonstrate hip abd/add of 5/5 bilaterally for pelvic stabillity during functional activiites    Time 4    Period Weeks    Status New    Target Date 01/01/20      PT SHORT TERM GOAL #2   Title Pt will be able to relax and bulge pelvic floor more quickly so he can perform at least 6 quick flicks in 10 seconds    Baseline 2.5    Time 6    Period Weeks    Status New    Target Date 01/01/20             PT Long Term Goals - 12/08/19 1222      PT LONG TERM GOAL #1   Title Pt will be ind with pain management and have at least 2 ways to reduce spasms down to mild when they become severe    Time 12    Period Weeks    Status New    Target Date 02/26/20      PT LONG TERM GOAL #2   Title ind with HEP    Time 12    Period Weeks    Status New    Target Date 02/26/20      PT LONG TERM GOAL #3   Title Pt will report 50% less intensity of pain when he is intimate with his wife    Time 12    Period Weeks    Status New    Target Date 02/26/20      PT LONG TERM GOAL #4   Title Pt will report at least 60% less difficulty and pressure needed to empty bladder    Time 12    Period Weeks    Status New    Target Date 02/26/20                  Plan - 12/08/19 1007    Clinical Impression Statement Pt presents to clinic due to chronic pelvic pain that is worsened  with intercourse.  He has pain from rectum into the scrotum at times.  Pt demonstrates some mild hip weakness of Lt abd/add 4+/5.  Some decreased ROM Lt hip IR 25% limited.  His h/s normal.  ASLR is a little easier with compression demonstrating some core weakness.  Pt has decreased lumbar ext 25%.  Pelvic floor was assessed and he demonstrates some decreased endurance, strength and coordination as mentioned - 5 sec hold; 2.5 quick flicks in 10 seconds, and high tone  TTP levators, OI, TP.  He has difficulty relaxing for normal pelvic floor resting tone.  Pt does demonstrate rounded and slouched sitting posture during today's evaluation as well. He will benefit from skilled PT to address these impairments in order to improved quality of life and be able to perform normal daily tasks without pain.    Personal Factors and Comorbidities Time since onset of injury/illness/exacerbation    Examination-Activity Limitations Toileting    Examination-Participation Restrictions Interpersonal Relationship    Stability/Clinical Decision Making Stable/Uncomplicated    Clinical Decision Making Low    Rehab Potential Good    PT Frequency 2x / week    PT Duration 12 weeks    PT Treatment/Interventions Biofeedback;ADLs/Self Care Home Management;Cryotherapy;Electrical Stimulation;Moist Heat;Neuromuscular re-education;Therapeutic exercise;Therapeutic activities;Patient/family education;Taping;Passive range of motion;Dry needling;Manual techniques    PT Next Visit Plan internal STM and/or biofeedback for downtraining and muscle length; breathing and stretches    PT Home Exercise Plan Access Code: [email or web address]    Consulted and Agree with Plan of Care Patient           Patient will benefit from skilled therapeutic intervention in order to improve the following deficits and impairments:  Pain, Decreased strength, Decreased coordination, Decreased range of motion, Increased muscle spasms  Visit Diagnosis: Cramp and spasm - Plan: PT plan of care cert/re-cert  Unspecified lack of coordination - Plan: PT plan of care cert/re-cert     Problem List Patient Active Problem List   Diagnosis Date Noted  . Varicose veins of lower extremities with other complications 08/16/2012  . Pain in limb 08/16/2012    [name], PT 12/08/2019, 12:58 PM  [facility] [address] [place], [place], [address] Phone:  [phone]   Fax:  [phone]  Name: Zeric Baranowski MRN: [id number] Date of Birth: [birth year]

## 2019-12-04 NOTE — Patient Instructions (Addendum)
Breathing during bowel movement:  1. Back straight - sit up low back to upper back not rounding forward - look straight ahead 2. Lean forward - only as far as possible with back staying straight 3. Breathe - slow as you can inhaling down into your belly and feeling pressure on the pelvic floor 4. Hard belly - keep belly like a hard ball on the max inhale 5. Blow hard - like blowing up a balloon and pressure down into pelvic floor 6. Squeeze and lift - tighten pelvic floor after BM to reset everything back into place  Access Code: [email or web address] URL: [email or web address] Date: 12/04/2019 Prepared by: Dwana Curd  Exercises Supine Diaphragmatic Breathing - 3 x daily - 7 x weekly - 10 reps - 1 sets Supine Hamstring Stretch with Doorway - 2 x daily - 7 x weekly - 3 reps - 1 sets - 20 sec hold Prone Press Up on Elbows - 1 x daily - 7 x weekly - 5 reps - 1 sets - 10 sec hold Supine Butterfly Groin Stretch - 1 x daily - 7 x weekly - 3 sets - 10 reps

## 2019-12-08 NOTE — Addendum Note (Signed)
Addended by: Beatris Si on: 12/08/2019 12:59 PM   Modules accepted: Orders

## 2019-12-11 ENCOUNTER — Ambulatory Visit: Payer: Managed Care, Other (non HMO) | Admitting: Physical Therapy

## 2019-12-11 ENCOUNTER — Other Ambulatory Visit: Payer: Self-pay

## 2019-12-11 ENCOUNTER — Encounter: Payer: Self-pay | Admitting: Physical Therapy

## 2019-12-11 DIAGNOSIS — R252 Cramp and spasm: Secondary | ICD-10-CM | POA: Diagnosis not present

## 2019-12-11 DIAGNOSIS — R279 Unspecified lack of coordination: Secondary | ICD-10-CM

## 2019-12-11 NOTE — Therapy (Signed)
[facility] [address] [place], [place], [address] Phone: [phone]   Fax:  [phone]  Physical Therapy Treatment  Patient Details  Name: Lan Mcneill MRN: [id number] Date of Birth: October 25, 1990 Referring Provider (PT): Crista Elliot, MD   Encounter Date: 12/11/2019   PT End of Session - 12/11/19 0952    Visit Number 2    Date for PT Re-Evaluation 02/26/20    PT Start Time 0804    PT Stop Time 0844    PT Time Calculation (min) 40 min    Activity Tolerance Patient tolerated treatment well    Behavior During Therapy [facility] for tasks assessed/performed           Past Medical History:  Diagnosis Date  . Acne   . Allergy April 2014   Rag weed  . Elevated liver enzymes   . GERD (gastroesophageal reflux disease)   . Hematuria     Past Surgical History:  Procedure Laterality Date  . 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 CYSTOSCOPY  11/02/2011   Procedure: CYSTOSCOPY FLEXIBLE;  Surgeon: Kathi Ludwig, MD;  Location: [facility];  Service: Urology;  Laterality: N/A;  IV SEDATION    There were no vitals filed for this visit.   Subjective Assessment - 12/11/19 0807    Subjective Pt states overall he is more comfortable sitting and everything.  The leakage hasn't happened because hasn't had intercourse and has taken a break.  Still hard to empty bladder    Patient Stated Goals be able to have relations with spouse without pain    Currently in Pain? No/denies                             [facility] Adult PT Treatment/Exercise - 12/11/19 0001      Neuro Re-ed    Neuro Re-ed Details  biofeedback 3-39mV intially able to relax and stay relaxed during stretching; attempt to intiate strengthening and cues to not hold breath increased tone to 20-76mV at rest after doing bridges then able to return to 2-3 after stretching       Lumbar Exercises: Stretches   Active Hamstring Stretch Right;Left;2 reps;20 seconds    Hip Flexor Stretch Right;Left;2 reps;20 seconds    Press Ups 3 reps;10 seconds    Quad Stretch Right;Left;3 reps    ITB Stretch Right;Left;3 reps    Piriformis Stretch Right;Left;2 reps;30 seconds    Figure 4 Stretch 1 rep;20 seconds      Lumbar Exercises: Supine   Bridge 5 reps;5 seconds    Bridge Limitations cues to breathe and brace core                  PT Education - 12/11/19 0950    Education Details Access Code: [email or web address]    Person(s) Educated Patient    Methods Explanation;Demonstration;Verbal cues;Handout    Comprehension Verbalized understanding;Returned demonstration            PT Short Term Goals - 12/11/19 0951      PT SHORT TERM GOAL #1   Title Pt will demonstrate hip abd/add of 5/5 bilaterally for pelvic stabillity during functional activiites    Status On-going      PT SHORT TERM GOAL #2   Title Pt will be able to relax and bulge pelvic floor more quickly so he  can perform at least 6 quick flicks in 10 seconds    Status On-going             PT Long Term Goals - 12/08/19 1222      PT LONG TERM GOAL #1   Title Pt will be ind with pain management and have at least 2 ways to reduce spasms down to mild when they become severe    Time 12    Period Weeks    Status New    Target Date 02/26/20      PT LONG TERM GOAL #2   Title ind with HEP    Time 12    Period Weeks    Status New    Target Date 02/26/20      PT LONG TERM GOAL #3   Title Pt will report 50% less intensity of pain when he is intimate with his wife    Time 12    Period Weeks    Status New    Target Date 02/26/20      PT LONG TERM GOAL #4   Title Pt will report at least 60% less difficulty and pressure needed to empty bladder    Time 12    Period Weeks    Status New    Target Date 02/26/20                 Plan - 12/11/19 0945    Clinical Impression Statement Pt did well  with biofeedback.  His resting tone appears normal based on external electrodes placed across transverse peroneum.  Pt has been resting more and stretching and reports improved with that intial instructions. Pt did have some increased tone when doing kegel and bridges but was reduced back down after following up with stretches.  Pt needed cues to breathe when bracing with core and pelvic floor and will benefit from continuing to work on coordination.    PT Treatment/Interventions Biofeedback;ADLs/Self Care Home Management;Cryotherapy;Electrical Stimulation;Moist Heat;Neuromuscular re-education;Therapeutic exercise;Therapeutic activities;Patient/family education;Taping;Passive range of motion;Dry needling;Manual techniques    PT Next Visit Plan quick flicks and gluteal strength; internal STM and/or biofeedback for downtraining if needed; adding strengthening lumbar STM and muscle length; coordination of breathing when bracing    PT Home Exercise Plan Access Code: [email or web address]    Consulted and Agree with Plan of Care Patient           Patient will benefit from skilled therapeutic intervention in order to improve the following deficits and impairments:  Pain, Decreased strength, Decreased coordination, Decreased range of motion, Increased muscle spasms  Visit Diagnosis: Cramp and spasm  Unspecified lack of coordination     Problem List Patient Active Problem List   Diagnosis Date Noted  . Varicose veins of lower extremities with other complications 08/16/2012  . Pain in limb 08/16/2012    [name], PT 12/11/2019, 10:07 AM  [facility] [address] [place], [place], [address] Phone: [phone]   Fax:  [phone]  Name: Romaldo Saville MRN: [id number] Date of Birth: [birth year]

## 2019-12-11 NOTE — Patient Instructions (Signed)
Access Code: [email or web address] URL: [email or web address] Date: 12/11/2019 Prepared by: Dwana Curd  Exercises Supine Diaphragmatic Breathing - 3 x daily - 7 x weekly - 10 reps - 1 sets Supine Hamstring Stretch with Doorway - 2 x daily - 7 x weekly - 3 reps - 1 sets - 20 sec hold Prone Press Up on Elbows - 1 x daily - 7 x weekly - 5 reps - 1 sets - 10 sec hold Supine Butterfly Groin Stretch - 1 x daily - 7 x weekly - 3 sets - 10 reps Supine Figure 4 Piriformis Stretch - 1 x daily - 7 x weekly - 3 sets - 10 reps Supine ITB Stretch with Strap - 1 x daily - 7 x weekly - 3 reps - 1 sets - 30 sec hold Supine Hamstring Stretch with Strap - 1 x daily - 7 x weekly - 3 reps - 1 sets - 30 sec hold Prone Quadriceps Stretch with Strap - 1 x daily - 7 x weekly - 3 reps - 1 sets - 30sec hold Standing ITB Stretch - 1 x daily - 7 x weekly - 3 reps - 1 sets - 30 sec hold Doorway Kneeling Hip Flexor Stretch - 1 x daily - 7 x weekly - 3 sets - 10 reps

## 2019-12-18 ENCOUNTER — Other Ambulatory Visit: Payer: Self-pay

## 2019-12-18 ENCOUNTER — Encounter: Payer: Self-pay | Admitting: Physical Therapy

## 2019-12-18 ENCOUNTER — Ambulatory Visit: Payer: Managed Care, Other (non HMO) | Attending: Urology | Admitting: Physical Therapy

## 2019-12-18 DIAGNOSIS — R279 Unspecified lack of coordination: Secondary | ICD-10-CM | POA: Insufficient documentation

## 2019-12-18 DIAGNOSIS — R252 Cramp and spasm: Secondary | ICD-10-CM | POA: Diagnosis present

## 2019-12-18 NOTE — Patient Instructions (Addendum)
Breathing during bowel movement:  1. Back straight - sit up low back to upper back not rounding forward - look straight ahead 2. Lean forward - only as far as possible with back staying straight 3. Breathe - slow as you can inhaling down into your belly and feeling pressure on the pelvic floor 4. Hard belly - keep belly like a hard ball on the max inhale 5. Blow hard - like blowing up a balloon and pressure down into pelvic floor 6. Squeeze and lift - tighten pelvic floor after BM to reset everything back into place  *squatty potty or similar stool

## 2019-12-18 NOTE — Therapy (Signed)
[facility] [address] [place], [place], [address] Phone: [phone]   Fax:  [phone]  Physical Therapy Treatment  Patient Details  Name: Nathaniel Mann MRN: [id number] Date of Birth: 1990/11/07 Referring Provider (PT): Crista Elliot, MD   Encounter Date: 12/18/2019   PT End of Session - 12/18/19 0801    Visit Number 3    Date for PT Re-Evaluation 02/26/20    PT Start Time 0801    PT Stop Time 0841    PT Time Calculation (min) 40 min    Activity Tolerance Patient tolerated treatment well           Past Medical History:  Diagnosis Date  . Acne   . Allergy April 2014   Rag weed  . Elevated liver enzymes   . GERD (gastroesophageal reflux disease)   . Hematuria     Past Surgical History:  Procedure Laterality Date  . 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 CYSTOSCOPY  11/02/2011   Procedure: CYSTOSCOPY FLEXIBLE;  Surgeon: Kathi Ludwig, MD;  Location: [facility];  Service: Urology;  Laterality: N/A;  IV SEDATION    There were no vitals filed for this visit.   Subjective Assessment - 12/18/19 0803    Subjective Pt states he is feeling better overall withthe stretching.  Sometimes I get tight when sitting on the toilet to have a BM for too long    Patient Stated Goals be able to have relations with spouse without pain    Currently in Pain? No/denies                             [facility] Adult PT Treatment/Exercise - 12/18/19 0001      Self-Care   Self-Care Other Self-Care Comments    Other Self-Care Comments  toileting techniques and breathing for correctly relaxing puborectalis; foam rolling to gluteals for soft tissue work at home      Manual Therapy   Manual Therapy Internal Pelvic Floor    Manual therapy comments pt identity confirmed and informed consent given to perform internal soft tissue  assessment                  PT Education - 12/18/19 0841    Education Details toileting techniques    Person(s) Educated Patient    Methods Explanation;Demonstration;Verbal cues;Handout    Comprehension Verbalized understanding;Returned demonstration            PT Short Term Goals - 12/11/19 0951      PT SHORT TERM GOAL #1   Title Pt will demonstrate hip abd/add of 5/5 bilaterally for pelvic stabillity during functional activiites    Status On-going      PT SHORT TERM GOAL #2   Title Pt will be able to relax and bulge pelvic floor more quickly so he can perform at least 6 quick flicks in 10 seconds    Status On-going             PT Long Term Goals - 12/08/19 1222      PT LONG TERM GOAL #1   Title Pt will be ind with pain management and have at least 2 ways to reduce spasms down to mild when they become severe    Time 12    Period Weeks    Status New  Target Date 02/26/20      PT LONG TERM GOAL #2   Title ind with HEP    Time 12    Period Weeks    Status New    Target Date 02/26/20      PT LONG TERM GOAL #3   Title Pt will report 50% less intensity of pain when he is intimate with his wife    Time 12    Period Weeks    Status New    Target Date 02/26/20      PT LONG TERM GOAL #4   Title Pt will report at least 60% less difficulty and pressure needed to empty bladder    Time 12    Period Weeks    Status New    Target Date 02/26/20                 Plan - 12/18/19 0843    Clinical Impression Statement Pt tolerated treatment well.  Focused on STM to puborectalis.  Contracting muscles caused increased tension but breathing and bulging helped to relax.  Educated on toileting techniques and foam rolling to maintain imporved muscle length    PT Treatment/Interventions Biofeedback;ADLs/Self Care Home Management;Cryotherapy;Electrical Stimulation;Moist Heat;Neuromuscular re-education;Therapeutic exercise;Therapeutic activities;Patient/family  education;Taping;Passive range of motion;Dry needling;Manual techniques    PT Next Visit Plan internal STM to stretch puborectalis; press up stretch and cat cow; gentle core and gluteal strength    PT Home Exercise Plan Access Code: [email or web address]    Consulted and Agree with Plan of Care Patient           Patient will benefit from skilled therapeutic intervention in order to improve the following deficits and impairments:  Pain, Decreased strength, Decreased coordination, Decreased range of motion, Increased muscle spasms  Visit Diagnosis: Cramp and spasm  Unspecified lack of coordination     Problem List Patient Active Problem List   Diagnosis Date Noted  . Varicose veins of lower extremities with other complications 08/16/2012  . Pain in limb 08/16/2012    [name], PT 12/18/2019, 10:11 AM  [facility] [address] [place], [place], [address] Phone: [phone]   Fax:  [phone]  Name: Nathaniel Mann MRN: [id number] Date of Birth: 05/13/1991

## 2019-12-23 ENCOUNTER — Encounter: Payer: Self-pay | Admitting: Physical Therapy

## 2019-12-23 ENCOUNTER — Ambulatory Visit: Payer: Managed Care, Other (non HMO) | Admitting: Physical Therapy

## 2019-12-23 ENCOUNTER — Other Ambulatory Visit: Payer: Self-pay

## 2019-12-23 DIAGNOSIS — R279 Unspecified lack of coordination: Secondary | ICD-10-CM

## 2019-12-23 DIAGNOSIS — R252 Cramp and spasm: Secondary | ICD-10-CM

## 2019-12-23 NOTE — Therapy (Signed)
[facility] [address] [place], [place], [address] Phone: [phone]   Fax:  [phone]  Physical Therapy Treatment  Patient Details  Name: Nathaniel Mann MRN: [id number] Date of Birth: July 23, 1990 Referring Provider (PT): Crista Elliot, MD   Encounter Date: 12/23/2019   PT End of Session - 12/23/19 0809    Visit Number 4    Date for PT Re-Evaluation 02/26/20    PT Start Time 0802    PT Stop Time 0842    PT Time Calculation (min) 40 min    Activity Tolerance Patient tolerated treatment well    Behavior During Therapy [facility] for tasks assessed/performed           Past Medical History:  Diagnosis Date  . Acne   . Allergy April 2014   Rag weed  . Elevated liver enzymes   . GERD (gastroesophageal reflux disease)   . Hematuria     Past Surgical History:  Procedure Laterality Date  . COLONOSCOPY  MAY 2013   NEGATIVE FINDINGS--  BROTHER DECEASED AT AGE 79 FROM COLON CANCER  . CYSTOSCOPY  11/02/2011   Procedure: CYSTOSCOPY FLEXIBLE;  Surgeon: Kathi Ludwig, MD;  Location: [facility];  Service: Urology;  Laterality: N/A;  IV SEDATION    There were no vitals filed for this visit.   Subjective Assessment - 12/23/19 0807    Subjective Pt states he has felt a  lot better since previous session.    Patient Stated Goals be able to have relations with spouse without pain    Currently in Pain? No/denies                             [facility] Adult PT Treatment/Exercise - 12/23/19 0001      Lumbar Exercises: Seated   Other Seated Lumbar Exercises pelvic tilts and circles on ball      Lumbar Exercises: Prone   Other Prone Lumbar Exercises hip flexor and quad stretch - strap and towel under thigh - 60 sec      Manual Therapy   Manual Therapy Internal Pelvic Floor    Manual therapy comments pt identity confirmed and informed consent given to perform internal soft tissue  assessment    Internal Pelvic Floor puborectalis and levators bilat cues to bulge to relax muscles            Trigger Point Dry Needling - 12/23/19 0001    Consent Given? Yes    Education Handout Provided --   verbally explained will give handout next   Muscles Treated Back/Hip Thoracic multifidi;Lumbar multifidi    Dry Needling Comments bil    Lumbar multifidi Response Twitch response elicited;Palpable increased muscle length    Thoracic multifidi response Twitch response elicited;Palpable increased muscle length                  PT Short Term Goals - 12/11/19 0951      PT SHORT TERM GOAL #1   Title Pt will demonstrate hip abd/add of 5/5 bilaterally for pelvic stabillity during functional activiites    Status On-going      PT SHORT TERM GOAL #2   Title Pt will be able to relax and bulge pelvic floor more quickly so he can perform at least 6 quick flicks in 10 seconds    Status On-going  PT Long Term Goals - 12/08/19 1222      PT LONG TERM GOAL #1   Title Pt will be ind with pain management and have at least 2 ways to reduce spasms down to mild when they become severe    Time 12    Period Weeks    Status New    Target Date 02/26/20      PT LONG TERM GOAL #2   Title ind with HEP    Time 12    Period Weeks    Status New    Target Date 02/26/20      PT LONG TERM GOAL #3   Title Pt will report 50% less intensity of pain when he is intimate with his wife    Time 12    Period Weeks    Status New    Target Date 02/26/20      PT LONG TERM GOAL #4   Title Pt will report at least 60% less difficulty and pressure needed to empty bladder    Time 12    Period Weeks    Status New    Target Date 02/26/20                 Plan - 12/23/19 0844    Clinical Impression Statement Pt reports he is feeling much better.  Pt tolerated internal STM massage much better with more pressure applied and able to get over to levator muscles today.  Pt was  given information about dry needling and included this is today's session.  Palpated increased muscle length and pt felt better after treatment but did feel sore as in he could tell muscles has been manipulated.    PT Treatment/Interventions Biofeedback;ADLs/Self Care Home Management;Cryotherapy;Electrical Stimulation;Moist Heat;Neuromuscular re-education;Therapeutic exercise;Therapeutic activities;Patient/family education;Taping;Passive range of motion;Dry needling;Manual techniques    PT Next Visit Plan hip MMT and quick flick test; internal STM to stretch puborectalis, bilat levators; gentle core and gluteal strength, hip hinges    PT Home Exercise Plan Access Code: [email or web address]    Consulted and Agree with Plan of Care Patient           Patient will benefit from skilled therapeutic intervention in order to improve the following deficits and impairments:  Pain, Decreased strength, Decreased coordination, Decreased range of motion, Increased muscle spasms  Visit Diagnosis: Cramp and spasm  Unspecified lack of coordination     Problem List Patient Active Problem List   Diagnosis Date Noted  . Varicose veins of lower extremities with other complications 08/16/2012  . Pain in limb 08/16/2012    [name], PT 12/23/2019, 9:51 AM  [facility] [address] [place], [place], [address] Phone: [phone]   Fax:  [phone]  Name: Ameya Kutz MRN: [id number] Date of Birth: 05-20-1990

## 2020-01-01 ENCOUNTER — Encounter: Payer: Self-pay | Admitting: Physical Therapy

## 2020-01-01 ENCOUNTER — Ambulatory Visit: Payer: Managed Care, Other (non HMO) | Admitting: Physical Therapy

## 2020-01-01 ENCOUNTER — Other Ambulatory Visit: Payer: Self-pay

## 2020-01-01 DIAGNOSIS — R252 Cramp and spasm: Secondary | ICD-10-CM

## 2020-01-01 DIAGNOSIS — R279 Unspecified lack of coordination: Secondary | ICD-10-CM

## 2020-01-01 NOTE — Therapy (Signed)
[facility] [address], [place], [place], [address] Phone: [phone]   Fax:  [phone]  Physical Therapy Treatment  Patient Details  Name: Nathaniel Mann MRN: [id number] Date of Birth: 1990-09-29 Referring Provider (PT): Lucas Mallow, MD   Encounter Date: 01/01/2020   PT End of Session - 01/01/20 0842    Visit Number 5    Date for PT Re-Evaluation 02/26/20    PT Start Time 0804    PT Stop Time 0840    PT Time Calculation (min) 36 min    Activity Tolerance Patient tolerated treatment well    Behavior During Therapy [facility] for tasks assessed/performed           Past Medical History:  Diagnosis Date  . Acne   . Allergy April 2014   Rag weed  . Elevated liver enzymes   . GERD (gastroesophageal reflux disease)   . Hematuria     Past Surgical History:  Procedure Laterality Date  . COLONOSCOPY  MAY 2013   NEGATIVE FINDINGS--  BROTHER DECEASED AT AGE 68 FROM COLON CANCER  . CYSTOSCOPY  11/02/2011   Procedure: CYSTOSCOPY FLEXIBLE;  Surgeon: Ailene Rud, MD;  Location: [facility];  Service: Urology;  Laterality: N/A;  IV SEDATION    There were no vitals filed for this visit.   Subjective Assessment - 01/01/20 0806    Subjective Pt states everything is feeling pretty good.    Patient Stated Goals be able to have relations with spouse without pain    Currently in Pain? No/denies                             [facility] Adult PT Treatment/Exercise - 01/01/20 0001      Lumbar Exercises: Standing   Other Standing Lumbar Exercises hip hinge and squat against wall for TC; TC needed to keep spine in neutral      Manual Therapy   Manual Therapy Soft tissue mobilization    Soft tissue mobilization lumbar and thoracic paraspinals            Trigger Point Dry Needling - 01/01/20 0001    Consent Given? Yes    Muscles Treated Back/Hip Thoracic multifidi;Lumbar  multifidi    Dry Needling Comments bil    Lumbar multifidi Response Twitch response elicited;Palpable increased muscle length    Thoracic multifidi response Twitch response elicited;Palpable increased muscle length                PT Education - 01/01/20 0841    Education Details added hip hinge    Person(s) Educated Patient    Methods Explanation;Demonstration;Handout;Verbal cues    Comprehension Verbalized understanding;Returned demonstration            PT Short Term Goals - 12/11/19 0951      PT SHORT TERM GOAL #1   Title Pt will demonstrate hip abd/add of 5/5 bilaterally for pelvic stabillity during functional activiites    Status On-going      PT SHORT TERM GOAL #2   Title Pt will be able to relax and bulge pelvic floor more quickly so he can perform at least 6 quick flicks in 10 seconds    Status On-going             PT Long Term Goals - 01/01/20 0810      PT LONG TERM GOAL #1   Title Pt will  be ind with pain management and have at least 2 ways to reduce spasms down to mild when they become severe    Baseline haven't had intercourse lately    Status On-going      PT LONG TERM GOAL #2   Title ind with HEP    Status On-going      PT LONG TERM GOAL #3   Title Pt will report 50% less intensity of pain when he is intimate with his wife    Baseline 60-70%    Status Achieved      PT LONG TERM GOAL #4   Title Pt will report at least 60% less difficulty and pressure needed to empty bladder    Baseline 90% improved    Status Achieved                 Plan - 01/01/20 0842    Clinical Impression Statement Pt met two long term goals and is making good and steady progress.  He still has some difficulty fully relaxing during sexual activity and urinating but is much better.  Pt is expected to make progress and will also benefit from skilled PT to continue working on posture and core strength so he does not continue to overuse pelvic floor muscles.    PT  Treatment/Interventions Biofeedback;ADLs/Self Care Home Management;Cryotherapy;Electrical Stimulation;Moist Heat;Neuromuscular re-education;Therapeutic exercise;Therapeutic activities;Patient/family education;Taping;Passive range of motion;Dry needling;Manual techniques    PT Next Visit Plan internal STM to puborectalis; qped core and hip strength    PT Home Exercise Plan Access Code: [email or web address]           Patient will benefit from skilled therapeutic intervention in order to improve the following deficits and impairments:  Pain, Decreased strength, Decreased coordination, Decreased range of motion, Increased muscle spasms  Visit Diagnosis: Cramp and spasm  Unspecified lack of coordination     Problem List Patient Active Problem List   Diagnosis Date Noted  . Varicose veins of lower extremities with other complications 32/00/3794  . Pain in limb 08/16/2012    Camillo Flaming Desenglau, PT 01/01/2020, 11:06 AM  [facility] [address] [place], [place], [address] Phone: [phone]   Fax:  [phone]  Name: Nathaniel Mann MRN: [id number] Date of Birth: March 02, 1991

## 2020-01-01 NOTE — Patient Instructions (Signed)
[id number] Code: [email or web address] URL: [email or web address] Date: 01/01/2020 Prepared by: Dwana Curd  Exercises Supine Diaphragmatic Breathing - 3 x daily - 7 x weekly - 10 reps - 1 sets Supine Hamstring Stretch with Doorway - 2 x daily - 7 x weekly - 3 reps - 1 sets - 20 sec hold Prone Press Up on Elbows - 1 x daily - 7 x weekly - 5 reps - 1 sets - 10 sec hold Supine Butterfly Groin Stretch - 1 x daily - 7 x weekly - 3 sets - 10 reps Supine Figure 4 Piriformis Stretch - 1 x daily - 7 x weekly - 3 sets - 10 reps Supine ITB Stretch with Strap - 1 x daily - 7 x weekly - 3 reps - 1 sets - 30 sec hold Supine Hamstring Stretch with Strap - 1 x daily - 7 x weekly - 3 reps - 1 sets - 30 sec hold Prone Quadriceps Stretch with Strap - 1 x daily - 7 x weekly - 3 reps - 1 sets - 30sec hold Standing ITB Stretch - 1 x daily - 7 x weekly - 3 reps - 1 sets - 30 sec hold Doorway Kneeling Hip Flexor Stretch - 1 x daily - 7 x weekly - 3 sets - 10 reps Standing Hip Hinge with Dowel - 1 x daily - 7 x weekly - 3 sets - 10 reps

## 2020-01-08 ENCOUNTER — Encounter: Payer: Self-pay | Admitting: Physical Therapy

## 2020-01-08 ENCOUNTER — Other Ambulatory Visit: Payer: Self-pay

## 2020-01-08 ENCOUNTER — Ambulatory Visit: Payer: Managed Care, Other (non HMO) | Admitting: Physical Therapy

## 2020-01-08 DIAGNOSIS — R252 Cramp and spasm: Secondary | ICD-10-CM | POA: Diagnosis not present

## 2020-01-08 DIAGNOSIS — R279 Unspecified lack of coordination: Secondary | ICD-10-CM

## 2020-01-08 NOTE — Patient Instructions (Addendum)

## 2020-01-08 NOTE — Therapy (Signed)
[facility] [address] [place], [place], [address] Phone: [phone]   Fax:  [phone]  Physical Therapy Treatment  Patient Details  Name: Nathaniel Mann MRN: [id number] Date of Birth: [birth year] Referring Provider (PT): Crista Elliot, MD   Encounter Date: 01/08/2020   PT End of Session - 01/08/20 0804    Visit Number 6    Date for PT Re-Evaluation 02/26/20    PT Start Time 0804    PT Stop Time 0831    PT Time Calculation (min) 27 min    Activity Tolerance Patient tolerated treatment well    Behavior During Therapy [facility] for tasks assessed/performed           Past Medical History:  Diagnosis Date  . Acne   . Allergy April 2014   Rag weed  . Elevated liver enzymes   . GERD (gastroesophageal reflux disease)   . Hematuria     Past Surgical History:  Procedure Laterality Date  . COLONOSCOPY  MAY 2013   NEGATIVE FINDINGS--  BROTHER DECEASED AT AGE 66 FROM COLON CANCER  . CYSTOSCOPY  11/02/2011   Procedure: CYSTOSCOPY FLEXIBLE;  Surgeon: Kathi Ludwig, MD;  Location: [facility];  Service: Urology;  Laterality: N/A;  IV SEDATION    There were no vitals filed for this visit.   Subjective Assessment - 01/08/20 0838    Subjective Pt states he is noticing when he has to relax and everything is feeling better, easier to go to the bathroom and can have an orgasm without as much difficulty.    Patient Stated Goals be able to have relations with spouse without pain    Currently in Pain? No/denies                             [facility] Adult PT Treatment/Exercise - 01/08/20 0001      Manual Therapy   Manual therapy comments pt identity confirmed and informed consent given to perform internal soft tissue assessment    Soft tissue mobilization lumbar and thoracic paraspinals    Internal Pelvic Floor puborectalis, OI, and levators bilat cues to bulge to relax muscles                     PT Short Term Goals - 12/11/19 0951      PT SHORT TERM GOAL #1   Title Pt will demonstrate hip abd/add of 5/5 bilaterally for pelvic stabillity during functional activiites    Status On-going      PT SHORT TERM GOAL #2   Title Pt will be able to relax and bulge pelvic floor more quickly so he can perform at least 6 quick flicks in 10 seconds    Status On-going             PT Long Term Goals - 01/01/20 0810      PT LONG TERM GOAL #1   Title Pt will be ind with pain management and have at least 2 ways to reduce spasms down to mild when they become severe    Baseline haven't had intercourse lately    Status On-going      PT LONG TERM GOAL #2   Title ind with HEP    Status On-going      PT LONG TERM GOAL #3   Title Pt will report 50% less intensity of pain when he is intimate with  his wife    Baseline 60-70%    Status Achieved      PT LONG TERM GOAL #4   Title Pt will report at least 60% less difficulty and pressure needed to empty bladder    Baseline 90% improved    Status Achieved                 Plan - 01/08/20 0833    Clinical Impression Statement Pt has significantly less tension in the pelvic floor,  Some tensionin obdurator and released with internal soft tissue work.  Pt had some tension in lumbar and thoracic paraspinlas.  Reviewed HEP and will continue at this time with current HEP.  He will be away for two weeks on vacation and re-assess when he returns.    PT Treatment/Interventions Biofeedback;ADLs/Self Care Home Management;Cryotherapy;Electrical Stimulation;Moist Heat;Neuromuscular re-education;Therapeutic exercise;Therapeutic activities;Patient/family education;Taping;Passive range of motion;Dry needling;Manual techniques    PT Next Visit Plan qped, core and hip stretch, single leg bridges, foam rolling glutes and hamstrings    PT Home Exercise Plan Access Code: [email or web address]    Consulted and Agree with Plan of Care Patient            Patient will benefit from skilled therapeutic intervention in order to improve the following deficits and impairments:  Pain, Decreased strength, Decreased coordination, Decreased range of motion, Increased muscle spasms  Visit Diagnosis: Cramp and spasm  Unspecified lack of coordination     Problem List Patient Active Problem List   Diagnosis Date Noted  . Varicose veins of lower extremities with other complications 08/16/2012  . Pain in limb 08/16/2012    [name], PT 01/08/2020, 8:41 AM  [facility] [address] [place], [place], [address] Phone: [phone]   Fax:  [phone]  Name: Nathaniel Mann MRN: [id number] Date of Birth: 1990-08-15

## 2020-01-29 ENCOUNTER — Encounter: Payer: Managed Care, Other (non HMO) | Admitting: Physical Therapy

## 2020-02-03 ENCOUNTER — Other Ambulatory Visit: Payer: Self-pay

## 2020-02-03 ENCOUNTER — Encounter: Payer: Self-pay | Admitting: Physical Therapy

## 2020-02-03 ENCOUNTER — Ambulatory Visit: Payer: Managed Care, Other (non HMO) | Attending: Urology | Admitting: Physical Therapy

## 2020-02-03 DIAGNOSIS — R279 Unspecified lack of coordination: Secondary | ICD-10-CM | POA: Diagnosis present

## 2020-02-03 DIAGNOSIS — R252 Cramp and spasm: Secondary | ICD-10-CM | POA: Diagnosis not present

## 2020-02-03 NOTE — Therapy (Addendum)
[facility] [address], [place], [place], [address] Phone: [phone]   Fax:  [phone]  Physical Therapy Treatment  Patient Details  Name: Nathaniel Mann MRN: [id number] Date of Birth: August 30, 1990 Referring Provider (PT): Lucas Mallow, MD   Encounter Date: 02/03/2020   PT End of Session - 02/03/20 0800    Visit Number 7    Date for PT Re-Evaluation 02/26/20    PT Start Time 0800    PT Stop Time 0838    PT Time Calculation (min) 38 min    Activity Tolerance Patient tolerated treatment well    Behavior During Therapy [facility] for tasks assessed/performed           Past Medical History:  Diagnosis Date  . Acne   . Allergy April 2014   Rag weed  . Elevated liver enzymes   . GERD (gastroesophageal reflux disease)   . Hematuria     Past Surgical History:  Procedure Laterality Date  . 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 CYSTOSCOPY  11/02/2011   Procedure: CYSTOSCOPY FLEXIBLE;  Surgeon: Ailene Rud, MD;  Location: [facility];  Service: Urology;  Laterality: N/A;  IV SEDATION    There were no vitals filed for this visit.   Subjective Assessment - 02/03/20 0857    Subjective Pt states he did a lot of hiking and able to do the stretches when he started feeling pain.  Still tensing a lot during intercourse but no major pain during or after.    Patient Stated Goals be able to have relations with spouse without pain    Currently in Pain? No/denies                             [facility] Adult PT Treatment/Exercise - 02/03/20 0001      Self-Care   Other Self-Care Comments  discussed using wand as way to release tight muscles as needed      Lumbar Exercises: Stretches   Active Hamstring Stretch Right;Left;2 reps;20 seconds      Lumbar Exercises: Standing   Other Standing Lumbar Exercises dead lift single leg  and with 15lb normal stance - 2x10    Other Standing Lumbar Exercises red band hip ext and diagonal; red band side step and monster walks                  PT Education - 02/03/20 0841    Education Details Access Code: [email or web address]    Person(s) Educated Patient    Methods Explanation;Demonstration;Verbal cues;Handout    Comprehension Verbalized understanding;Returned demonstration            PT Short Term Goals - 12/11/19 0951      PT SHORT TERM GOAL #1   Title Pt will demonstrate hip abd/add of 5/5 bilaterally for pelvic stabillity during functional activiites    Status On-going      PT SHORT TERM GOAL #2   Title Pt will be able to relax and bulge pelvic floor more quickly so he can perform at least 6 quick flicks in 10 seconds    Status On-going             PT Long Term Goals - 01/01/20 0810      PT LONG TERM GOAL #1   Title Pt will be ind with pain management  and have at least 2 ways to reduce spasms down to mild when they become severe    Baseline haven't had intercourse lately    Status On-going      PT LONG TERM GOAL #2   Title ind with HEP    Status On-going      PT LONG TERM GOAL #3   Title Pt will report 50% less intensity of pain when he is intimate with his wife    Baseline 60-70%    Status Achieved      PT LONG TERM GOAL #4   Title Pt will report at least 60% less difficulty and pressure needed to empty bladder    Baseline 90% improved    Status Achieved                 Plan - 02/03/20 0841    Clinical Impression Statement Pt had no major issues on his vacation. He continues to have some minor pain and still tenses up during intercourse but can manage the pain.  He is currently starting to get back into weight lifting.  He needed cues for correct form a couple of times during each exercises but once corrected was good at [employer].  Pt will benefit from 1-2 more visits to ensure he can manage pain as he returns to weight lifting/exercise  routine.    PT Treatment/Interventions Biofeedback;ADLs/Self Care Home Management;Cryotherapy;Electrical Stimulation;Moist Heat;Neuromuscular re-education;Therapeutic exercise;Therapeutic activities;Patient/family education;Taping;Passive range of motion;Dry needling;Manual techniques    PT Next Visit Plan f/u on foam rolling and new exercises; single leg and squat with pallof press; bridge on BOSU    PT Home Exercise Plan Access Code: [email or web address]    Consulted and Agree with Plan of Care Patient           Patient will benefit from skilled therapeutic intervention in order to improve the following deficits and impairments:  Pain, Decreased strength, Decreased coordination, Decreased range of motion, Increased muscle spasms  Visit Diagnosis: Cramp and spasm  Unspecified lack of coordination     Problem List Patient Active Problem List   Diagnosis Date Noted  . Varicose veins of lower extremities with other complications 76/73/4193  . Pain in limb 08/16/2012    Jule Ser, PT 02/03/2020, 8:58 AM  [facility] [address] [place], [place], [address] Phone: [phone]   Fax:  [phone]  Name: Nathaniel Mann MRN: [id number] Date of Birth: 08-Mar-1991  PHYSICAL THERAPY DISCHARGE SUMMARY  Visits from Start of Care: 7  Current functional level related to goals / functional outcomes: See above goals   Remaining deficits: See above   Education / Equipment: HEP  Plan: Patient agrees to discharge.  Patient goals were partially met. Patient is being discharged due to being pleased with the current functional level.  ?????     [employer], PT 03/08/20 9:53 AM

## 2020-02-03 NOTE — Patient Instructions (Signed)
Access Code: [email or web address] URL: [email or web address] Date: 02/03/2020 Prepared by: Dwana Curd  Exercises Supine Diaphragmatic Breathing - 3 x daily - 7 x weekly - 10 reps - 1 sets Supine Hamstring Stretch with Doorway - 2 x daily - 7 x weekly - 3 reps - 1 sets - 20 sec hold Prone Press Up on Elbows - 1 x daily - 7 x weekly - 5 reps - 1 sets - 10 sec hold Supine Butterfly Groin Stretch - 1 x daily - 7 x weekly - 3 sets - 10 reps Supine Figure 4 Piriformis Stretch - 1 x daily - 7 x weekly - 3 sets - 10 reps Supine ITB Stretch with Strap - 1 x daily - 7 x weekly - 3 reps - 1 sets - 30 sec hold Supine Hamstring Stretch with Strap - 1 x daily - 7 x weekly - 3 reps - 1 sets - 30 sec hold Prone Quadriceps Stretch with Strap - 1 x daily - 7 x weekly - 3 reps - 1 sets - 30sec hold Standing ITB Stretch - 1 x daily - 7 x weekly - 3 reps - 1 sets - 30 sec hold Doorway Kneeling Hip Flexor Stretch - 1 x daily - 7 x weekly - 3 sets - 10 reps Standing Hip Hinge with Dowel - 1 x daily - 7 x weekly - 3 sets - 10 reps Standing Hip Extension Kicks - 1 x daily - 7 x weekly - 3 sets - 10 reps Diagonal Hip Extension with Resistance - 1 x daily - 7 x weekly - 3 sets - 10 reps Forward T - 1 x daily - 7 x weekly - 3 sets - 10 reps Kettlebell Deadlift - 1 x daily - 7 x weekly - 3 sets - 10 reps Side Stepping with Resistance at Ankles - 1 x daily - 7 x weekly - 3 sets - 10 reps Band Walks - 1 x daily - 7 x weekly - 3 sets - 10 reps Walking Hamstring Stretch - 1 x daily - 7 x weekly - 3 sets - 10 reps

## 2020-02-10 ENCOUNTER — Encounter: Payer: Managed Care, Other (non HMO) | Admitting: Physical Therapy

## 2020-02-17 ENCOUNTER — Encounter: Payer: Managed Care, Other (non HMO) | Admitting: Physical Therapy

## 2020-02-24 ENCOUNTER — Encounter: Payer: Managed Care, Other (non HMO) | Admitting: Physical Therapy

## 2020-03-02 ENCOUNTER — Encounter: Payer: Managed Care, Other (non HMO) | Admitting: Physical Therapy

## 2020-03-09 ENCOUNTER — Encounter: Payer: Managed Care, Other (non HMO) | Admitting: Physical Therapy

## 2020-08-16 ENCOUNTER — Telehealth: Payer: Self-pay | Admitting: Diagnostic Neuroimaging

## 2020-08-16 ENCOUNTER — Other Ambulatory Visit: Payer: Self-pay

## 2020-08-16 ENCOUNTER — Ambulatory Visit (INDEPENDENT_AMBULATORY_CARE_PROVIDER_SITE_OTHER): Payer: Managed Care, Other (non HMO) | Admitting: Diagnostic Neuroimaging

## 2020-08-16 ENCOUNTER — Encounter: Payer: Self-pay | Admitting: *Deleted

## 2020-08-16 VITALS — BP 139/88 | HR 88 | Ht 71.0 in | Wt 174.2 lb

## 2020-08-16 DIAGNOSIS — R519 Headache, unspecified: Secondary | ICD-10-CM

## 2020-08-16 MED ORDER — RIZATRIPTAN BENZOATE 10 MG PO TBDP: 10.0000 mg | ORAL_TABLET | ORAL | 11 refills | Status: DC | PRN

## 2020-08-16 MED ORDER — AMITRIPTYLINE HCL 25 MG PO TABS: 25.0000 mg | ORAL_TABLET | Freq: Every day | ORAL | 3 refills | Status: DC

## 2020-08-16 NOTE — Progress Notes (Signed)
[facility]  PATIENT: Nathaniel Mann DOB: 12-03-1990  REFERRING CLINICIAN: Merri Brunette, MD HISTORY FROM: patient  REASON FOR VISIT: new consult    HISTORICAL  CHIEF COMPLAINT:  Chief Complaint  Patient presents with  . Chronic headaches    Rm 7 New Pt  "headaches began Jan 2021, worse since Nov 2021, hx of falling-hitting jaw 2018; seems like a cluster headache; only prescription tried Nurtec- ineffective; take Ibuprofen OTC, typically doesn't help much; triggers- lack of sleep/weather/stress/head phones/computer work"    HISTORY OF PRESENT ILLNESS:   30 year old male here for evaluation of headaches.  Since 2018 patient has had discomfort in bilateral ears and jaws, also headaches.  In the past 1 year having throbbing painful headaches on the left forehead, nausea, sensitive to light and sound.  Headaches last hours at a time.  He has 1-2 headaches per month lasting up to 2 days each.  Is tried Lexapro, Nurtec and ibuprofen without relief.  Family history of headaches in both parents.  No visual aura or scintillating scotoma.  Also has some chronic low back pain rating to the right leg since 2015.   REVIEW OF SYSTEMS: Full 14 system review of systems performed and negative with exception of: As per HPI.  ALLERGIES: Allergies  Allergen Reactions  . Omeprazole     Other reaction(s): rash  . Latex Rash  . Sulfa Antibiotics Rash    HOME MEDICATIONS: Outpatient Medications Prior to Visit  Medication Sig Dispense Refill  . ibuprofen (ADVIL) 200 MG tablet Take 200 mg by mouth every 6 (six) hours as needed.    . NURTEC 75 MG TBDP TAKE 1 TABLET AT ONSET OF HEADACHE, MAY TAKE ONE ADDITIONAL TABLET TWO HOURS LATER IF NEEDED. MAX OF TWO TABLETS IN 24 HOURS (Patient not taking: Reported on 08/16/2020)    . acetaminophen (TYLENOL) 650 MG CR tablet Take 650 mg by mouth every 8 (eight) hours as needed for pain.    Marland Kitchen ondansetron (ZOFRAN) 4 mg TABS tablet Take 4  tablets by mouth every 8 (eight) hours as needed. 4 tablet 0   No facility-administered medications prior to visit.    PAST MEDICAL HISTORY: Past Medical History:  Diagnosis Date  . Acne   . Allergy April 2014   Rag weed  . Elevated liver enzymes   . GERD (gastroesophageal reflux disease)   . Headache   . Hematuria     PAST SURGICAL HISTORY: Past Surgical History:  Procedure Laterality Date  . COLONOSCOPY  MAY 2013   NEGATIVE FINDINGS--  BROTHER DECEASED AT AGE 51 FROM COLON CANCER  . CYSTOSCOPY  11/02/2011   Procedure: CYSTOSCOPY FLEXIBLE;  Surgeon: Kathi Ludwig, MD;  Location: [facility];  Service: Urology;  Laterality: N/A;  IV SEDATION    FAMILY HISTORY: Family History  Problem Relation Age of Onset  . Cancer Brother        Colon  . Deep vein thrombosis Mother   . Headache Father     SOCIAL HISTORY: Social History   Socioeconomic History  . Marital status: Married    Spouse name: Nichola Sizer  . Number of children: 0  . Years of education: Not on file  . Highest education level: Bachelor's degree (e.g., BA, AB, BS)  Occupational History    Comment: employed  Tobacco Use  . Smoking status: Never Smoker  . Smokeless tobacco: Never Used  Substance and Sexual Activity  . Alcohol use: Yes    Comment: SOCIAL  .  Drug use: No  . Sexual activity: Not on file  Other Topics Concern  . Not on file  Social History Narrative   Lives with wife   Caffeine- none   Social Determinants of Health   Financial Resource Strain: Not on file  Food Insecurity: Not on file  Transportation Needs: Not on file  Physical Activity: Not on file  Stress: Not on file  Social Connections: Not on file  Intimate Partner Violence: Not on file     PHYSICAL EXAM  GENERAL EXAM/CONSTITUTIONAL: Vitals:  Vitals:   08/16/20 0816  BP: 139/88  Pulse: 88  Weight: 174 lb 3.2 oz (79 kg)  Height: 5\' 11"  (1.803 m)   Body mass index is 24.3 kg/m. Wt Readings from  Last 3 Encounters:  08/16/20 174 lb 3.2 oz (79 kg)  08/16/12 174 lb (78.9 kg)  10/31/11 170 lb (77.1 kg)    Patient is in no distress; well developed, nourished and groomed; neck is supple  CARDIOVASCULAR:  Examination of carotid arteries is normal; no carotid bruits  Regular rate and rhythm, no murmurs  Examination of peripheral vascular system by observation and palpation is normal  EYES:  Ophthalmoscopic exam of optic discs and posterior segments is normal; no papilledema or hemorrhages No exam data present  MUSCULOSKELETAL:  Gait, strength, tone, movements noted in Neurologic exam below  NEUROLOGIC: MENTAL STATUS:  No flowsheet data found.  awake, alert, oriented to person, place and time  recent and remote memory intact  normal attention and concentration  language fluent, comprehension intact, naming intact  fund of knowledge appropriate  CRANIAL NERVE:   2nd - no papilledema on fundoscopic exam  2nd, 3rd, 4th, 6th - pupils equal and reactive to light, visual fields full to confrontation, extraocular muscles intact, no nystagmus  5th - facial sensation symmetric  7th - facial strength symmetric  8th - hearing intact  9th - palate elevates symmetrically, uvula midline  11th - shoulder shrug symmetric  12th - tongue protrusion midline  MOTOR:   normal bulk and tone, full strength in the BUE, BLE  SENSORY:   normal and symmetric to light touch, temperature, vibration  COORDINATION:   finger-nose-finger, fine finger movements normal  REFLEXES:   deep tendon reflexes present and symmetric  GAIT/STATION:   narrow based gait; romberg is negative     DIAGNOSTIC DATA (LABS, IMAGING, TESTING) - I reviewed patient records, labs, notes, testing and imaging myself where available.  Lab Results  Component Value Date   WBC 7.0 05/13/2016   HGB 16.3 05/13/2016   HCT 47.3 05/13/2016   MCV 90.6 05/13/2016   PLT 155 05/13/2016       Component Value Date/Time   NA 132 (L) 05/13/2016 2213   K 3.1 (L) 05/13/2016 2213   CL 100 (L) 05/13/2016 2213   CO2 23 05/13/2016 2213   GLUCOSE 108 (H) 05/13/2016 2213   BUN 14 05/13/2016 2213   CREATININE 0.88 05/13/2016 2213   CALCIUM 8.7 (L) 05/13/2016 2213   PROT 7.1 05/13/2016 2213   ALBUMIN 4.1 05/13/2016 2213   AST 20 05/13/2016 2213   ALT 19 05/13/2016 2213   ALKPHOS 36 (L) 05/13/2016 2213   BILITOT 2.5 (H) 05/13/2016 2213   GFRNONAA >60 05/13/2016 2213   GFRAA >60 05/13/2016 2213   No results found for: CHOL, HDL, LDLCALC, LDLDIRECT, TRIG, CHOLHDL No results found for: 2214 No results found for: VITAMINB12 No results found for: TSH     ASSESSMENT AND PLAN  30 y.o. year old male here with:  Dx:  1. New onset headache      Meds tried: - lexapro, nurtec, ibuprofen   PLAN:  NEW ONSET HEADACHES (worsening since past few months) - check MRI brain wo   MIGRAINE TREATMENT PLAN (since 2020)  MIGRAINE PREVENTION  LIFESTYLE CHANGES -Stop or avoid smoking -Decrease or avoid caffeine / alcohol -Eat and sleep on a regular schedule -Exercise several times per week - start amitriptyline 25mg  at bedtime  Then consider  - erenumab (Aimovig) 70mg  monthly (may increase to 140mg  monthly) - fremanezumab (Ajovy) 225mg  monthly (or 675mg  every 3 months) - galazanezumab (Emgality) 240mg  loading dose; then 120mg  monthly   MIGRAINE RESCUE  - ibuprofen, tylenol as needed - start rizatriptan (Maxalt) 10mg  as needed for breakthrough headache; may repeat x 1 after 2 hours; max 2 tabs per day or 8 per month - consider ubrogepant ) 100mg  as needed for breakthrough headache; may repeat x 1 after 2 hours; max 2 tabs per day or 8 per month    Orders Placed This Encounter  Procedures  . MR BRAIN WO CONTRAST    Meds ordered this encounter  Medications  . amitriptyline (ELAVIL) 25 MG tablet    Sig: Take 1 tablet (25 mg total) by mouth at bedtime.     Dispense:  30 tablet    Refill:  3  . rizatriptan (MAXALT-MLT) 10 MG disintegrating tablet    Sig: Take 1 tablet (10 mg total) by mouth as needed for migraine. May repeat in 2 hours if needed    Dispense:  9 tablet    Refill:  11    Return in about 4 months (around 12/16/2020).    , MD 08/16/2020, 9:32 AM Certified in Neurology, Neurophysiology and Neuroimaging  [facility] [address], [address] [place], [place] [phone]

## 2020-08-16 NOTE — Telephone Encounter (Signed)
[employer] order sent to GI. They will obtain the auth and reach out to the patient to schedule.  °

## 2020-08-16 NOTE — Patient Instructions (Addendum)
  NEW ONSET HEADACHES - check MRI brain wo   MIGRAINE PREVENTION  LIFESTYLE CHANGES -Stop or avoid smoking -Decrease or avoid caffeine / alcohol -Eat and sleep on a regular schedule -Exercise several times per week - start amitriptyline 25mg  at bedtime   MIGRAINE RESCUE  - ibuprofen, tylenol as needed - start rizatriptan (Maxalt) 10mg  as needed for breakthrough headache; may repeat x 1 after 2 hours; max 2 tabs per day or 8 per month

## 2020-09-04 ENCOUNTER — Ambulatory Visit
Admission: RE | Admit: 2020-09-04 | Discharge: 2020-09-04 | Disposition: A | Discharge status: Home / Self Care | Payer: Managed Care, Other (non HMO) | Source: Ambulatory Visit | Attending: Diagnostic Neuroimaging | Admitting: Diagnostic Neuroimaging

## 2020-09-04 DIAGNOSIS — R519 Headache, unspecified: Secondary | ICD-10-CM

## 2020-09-13 ENCOUNTER — Telehealth: Payer: Self-pay

## 2020-09-13 ENCOUNTER — Telehealth: Payer: Self-pay | Admitting: Diagnostic Neuroimaging

## 2020-09-13 MED ORDER — TOPIRAMATE 50 MG PO TABS: ORAL_TABLET | ORAL | 5 refills | Status: DC

## 2020-09-13 NOTE — Telephone Encounter (Signed)
I called patient.  I advised him of unremarkable MRI results.  Patient reports that the amitriptyline has caused him to have a brain fog, shaking, panic attacks, dry mouth, and sleepiness.  He has a history of anxiety but it seems to have worsened since starting amitriptyline.  He does not feel the amitriptyline has benefitted the frequency of his migraines.  He has been on amitriptyline for about 11 days.  I spoke with Dr. Marjory Lies.  Patient should stop amitriptyline.  Patient should try Topamax 50 mg twice daily after 50 mg at bedtime for 2 weeks.  If this is ineffective we can consider an injectable CRGP.  I called patient.  I discussed this with him.  His mother took Topamax in the past and had nausea with it.  He is willing to give Topamax a try.  I advised him to take Topamax 50 mg at bedtime for 2 weeks and then increase to 50 mg twice daily if tolerated well.  Patient would like to use [facility].

## 2020-09-13 NOTE — Telephone Encounter (Signed)
I called the pt and he sts he has food poisoning. Pt wanted to know if it is contraindicated to take his Amitriptyline while dealing with this. I advised ok to continue to take he may have trouble keeping it down.  Pt reports not a lot of benefit from taking the amitriptyline right now but will continue for now. Reports a severe migraine/associated dizziness. Pt understands it can take 2-3 weeks before befits of med are noted.

## 2020-09-13 NOTE — Telephone Encounter (Signed)
Pt unsure if he has the flu, Covid-19 or food poisoning, pt asking if while dealing with this should he continue to take his amitriptyline (ELAVIL) 25 MG tablet

## 2020-09-13 NOTE — Telephone Encounter (Signed)
-----   Message from Vikram R Penumalli, MD sent at 09/13/2020  4:10 PM EDT ----- Unremarkable imaging results. Please call patient. Continue current plan. -VRP 

## 2020-09-29 ENCOUNTER — Ambulatory Visit: Payer: Managed Care, Other (non HMO) | Admitting: Neurology

## 2020-12-28 ENCOUNTER — Ambulatory Visit: Payer: Managed Care, Other (non HMO) | Admitting: Diagnostic Neuroimaging

## 2021-01-04 ENCOUNTER — Ambulatory Visit: Payer: Managed Care, Other (non HMO) | Admitting: Sports Medicine

## 2021-01-06 ENCOUNTER — Other Ambulatory Visit: Payer: Self-pay

## 2021-01-06 ENCOUNTER — Ambulatory Visit (INDEPENDENT_AMBULATORY_CARE_PROVIDER_SITE_OTHER): Payer: Managed Care, Other (non HMO) | Admitting: Sports Medicine

## 2021-01-06 VITALS — Ht 71.0 in | Wt 175.0 lb

## 2021-01-06 DIAGNOSIS — M25511 Pain in right shoulder: Secondary | ICD-10-CM | POA: Diagnosis not present

## 2021-01-06 NOTE — Patient Instructions (Signed)
It was great meeting you today Nathaniel Mann.  Your symptoms do suggest a possible labral injury to your right shoulder.  The only way to thoroughly evaluate that is to do an MRI arthrogram.  Check with your insurance and talk with your wife to see if that is something you would like for me to order.  If so, just call my office and let me know.  Your neck pain is likely compensatory.  It may respond to physical therapy and dry needling.  If that is something you need my help with, just let me know.  Congratulations on becoming a new father!Marland Kitchen

## 2021-01-06 NOTE — Progress Notes (Signed)
   Subjective:    Patient ID: Nathaniel Mann, male    DOB: [birth year], 30 y.o.   MRN: [id number]  HPI chief complaint: Right shoulder pain  Nathaniel Mann is a very pleasant right-hand-dominant 30 year old male that comes in complaining of approximately 1 year of right shoulder pain.  His pain all began after doing push-ups as part of a P90 X workout.  He stopped doing push-ups for about 2 months but his pain persisted.  He was seen by a physician in [facility] and an x-ray was obtained which was normal.  He was referred to physical therapy and did 6 weeks of PT without any improvement.  Since then he has simply been living with his pain.  His pain was initially in the anterior aspect of his shoulder but he is now also getting some pain and spasm of the right trapezius.  His pain is most noticeable when loading the joint with pressure.  He does endorse painful popping as well.  He has difficulty sleeping on his right side at night.  No prior shoulder surgeries.  Pain does not radiate down his arm.  No associated numbness or tingling.  Past medical history reviewed Medications reviewed Allergies reviewed    Review of Systems As above    Objective:   Physical Exam  Well-developed, well-nourished.  No acute distress.  Right shoulder: Patient has full active and passive range of motion.  No tenderness to palpation.  Rotator cuff strength is 5/5 and slightly reproducible pain with resisted supraspinatus.  Negative O'Brien's.  He does have some reproducible symptoms in full shoulder abduction and external rotation.  1+ sulcus sign.  Neurovascularly intact distally.       Assessment & Plan:   Chronic right shoulder pain worrisome for labral tear  Patient's symptoms are consistent with possible labral tear.  X-rays were unremarkable.  He tried several weeks of physical therapy without any improvement.  We discussed getting an MRI arthrogram to evaluate further but he would like to discuss  this with his wife and check with his insurance first.  I believe he is getting some compensatory neck and right trapezius pain and spasm which may benefit from a return to physical therapy or possibly dry needling.  He would like to think about this as well.  Our office will wait to hear back from him about how he would like to proceed.

## 2021-01-07 ENCOUNTER — Ambulatory Visit: Payer: Managed Care, Other (non HMO) | Admitting: Family Medicine

## 2021-01-07 NOTE — Addendum Note (Signed)
Addended by: Rutha Bouchard E on: 01/07/2021 09:08 AM   Modules accepted: Orders

## 2021-01-26 ENCOUNTER — Other Ambulatory Visit: Payer: Self-pay | Admitting: *Deleted

## 2021-01-26 DIAGNOSIS — M25511 Pain in right shoulder: Secondary | ICD-10-CM

## 2021-02-01 ENCOUNTER — Other Ambulatory Visit: Payer: Managed Care, Other (non HMO)

## 2021-02-03 ENCOUNTER — Telehealth: Payer: Self-pay | Admitting: Sports Medicine

## 2021-02-03 NOTE — Telephone Encounter (Signed)
  Patient was notified via telephone today that his insurance company has denied his MRI.  Insurance requires 6 weeks of provider directed treatment within the past 3 months.  Nathaniel Mann has resumed physical therapy and will follow-up with me via telephone on October 6 with an update (this will be 6 weeks from his last office visit).  If he does not improve with a second round of physical therapy then we will reorder his MRI arthrogram to rule out a labral tear.

## 2021-02-03 NOTE — Telephone Encounter (Signed)
Opened by accident

## 2021-02-04 ENCOUNTER — Ambulatory Visit (HOSPITAL_COMMUNITY): Payer: Managed Care, Other (non HMO) | Admitting: Occupational Therapy

## 2021-02-10 ENCOUNTER — Ambulatory Visit (HOSPITAL_COMMUNITY): Payer: Managed Care, Other (non HMO) | Attending: Sports Medicine | Admitting: Occupational Therapy

## 2021-02-10 ENCOUNTER — Other Ambulatory Visit: Payer: Self-pay

## 2021-02-10 DIAGNOSIS — R29898 Other symptoms and signs involving the musculoskeletal system: Secondary | ICD-10-CM | POA: Diagnosis present

## 2021-02-10 DIAGNOSIS — M25511 Pain in right shoulder: Secondary | ICD-10-CM | POA: Insufficient documentation

## 2021-02-10 DIAGNOSIS — G8929 Other chronic pain: Secondary | ICD-10-CM | POA: Insufficient documentation

## 2021-02-10 NOTE — Patient Instructions (Signed)

## 2021-02-10 NOTE — Therapy (Signed)
[facility] [address] [place], [place], [address] Phone: [phone]   Fax:  [phone]  Occupational Therapy Evaluation  Patient Details  Name: Nathaniel Mann MRN: [id number] Date of Birth: February 19, 1991 Referring Provider (OT): Dr. Reino Bellis   Encounter Date: 02/10/2021   OT End of Session - 02/10/21 0837     Visit Number 1    Number of Visits 4    Date for OT Re-Evaluation 03/12/21    Authorization Type [employer]    Authorization Time Period 90 visit limit PT/OT/SP, 0 used on eval    Authorization - Visit Number 1    Authorization - Number of Visits 90    OT Start Time 0737    OT Stop Time 0805    OT Time Calculation (min) 28 min    Activity Tolerance Patient tolerated treatment well    Behavior During Therapy [facility] for tasks assessed/performed             Past Medical History:  Diagnosis Date   Acne    Allergy April 2014   Rag weed   Elevated liver enzymes    GERD (gastroesophageal reflux disease)    Headache    Hematuria     Past Surgical History:  Procedure Laterality Date   COLONOSCOPY  MAY 2013   NEGATIVE FINDINGS--  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   Subjective Assessment - 02/10/21 0758     Subjective  S: It's been hurting since last June.    Pertinent History Pt is a 30 y/o male presenting with right shoulder pain that began in June 2021 when he was completing heavy work-outs, suspected labral tear. Pt with negative x-ray, MRI has been denied until therapy is completed. Pt was referred to occupational therapy for evaluation and treatment by Dr. Reino Bellis.    Special Tests 66/100    Patient Stated Goals To have less pain.    Currently in Pain? No/denies                [facility] OT Assessment - 02/10/21 0739       Assessment   Medical Diagnosis right shoulder pain    Referring Provider (OT) Dr. Reino Bellis    Onset Date/Surgical Date --   June 2021   Hand Dominance Right    Next MD Visit None scheduled    Prior Therapy PT in 2021      Precautions   Precautions None      Restrictions   Weight Bearing Restrictions No      Balance Screen   Has the patient fallen in the past 6 months No      Prior Function   Level of Independence Independent    Vocation Full time employment    Vocation Requirements works from [employer] work    Leisure hiking, running, walking the dog, video games, reading      ADL   ADL comments Pt is having difficulty with donning shirt, reaching overhead and behind back, working overhead for a sustained amount of time. Pt is unable to sleep on the right side, lean on the right side. Rolling shoulders or lifting heavy items aggravates the shoulder.      Written Expression  Dominant Hand Right      Cognition   Overall Cognitive Status Within Functional Limits for tasks assessed      Observation/Other Assessments   Focus on Therapeutic Outcomes (FOTO)  66/100      ROM / Strength   AROM / PROM / Strength AROM;Strength      Palpation   Palpation comment min fascial restrictions in right trapezius      AROM   Overall AROM Comments Assessed seated, er/IR adducted    AROM Assessment Site Shoulder    Right/Left Shoulder Right    Right Shoulder Flexion 180 Degrees    Right Shoulder ABduction 180 Degrees    Right Shoulder Internal Rotation 90 Degrees    Right Shoulder External Rotation 90 Degrees      Strength   Overall Strength Comments Assessed seated, er/IR adducted    Strength Assessment Site Shoulder    Right/Left Shoulder Right    Right Shoulder Flexion 5/5    Right Shoulder ABduction 4+/5    Right Shoulder Internal Rotation 5/5    Right Shoulder External Rotation 5/5                               OT Education - 02/10/21 0757     Education Details A/ROM    Person(s) Educated Patient    Methods Explanation;Demonstration;Handout    Comprehension Verbalized understanding;Returned demonstration              OT Short Term Goals - 02/10/21 0900       OT SHORT TERM GOAL #1   Title Pt will be provided with and educated on HEP to improve functional use of RUE as dominant during ADLs.    Time 4    Period Weeks    Status New    Target Date 03/12/21      OT SHORT TERM GOAL #2   Title Pt will decrease pain in RUE to 3/10 or less to improve ability to reach overhead and behind back during ADLs.    Time 4    Period Weeks    Status New      OT SHORT TERM GOAL #3   Title Pt will decrease RUE fascial restrictions to trace amounts to improve ability to perform RUE mobility tasks without compensatory strategies.    Time 4    Period Weeks    Status New      OT SHORT TERM GOAL #4   Title Pt will increase RUE scapular stability to improve ability to perform overhead tasks for 2+ minutes.    Time 4    Period Weeks                      Plan - 02/10/21 9562     Clinical Impression Statement A: Pt is a 30 y/o male with right shoulder pain, MD suspecting labral tear, requiring therapy prior to MRI approval. Pt with A/ROM and strength WNL, however is experiencing pain with functional tasks and any lifting tasks or overhead work. Pt reports occasional shooting pains in shoulder blade regions. Pt with frequent popping with ROM tasks above shoulder level, experiences consistent pain with IR behind back.    OT Occupational Profile and History Problem Focused Assessment - Including review of records relating to presenting problem    Occupational performance deficits (Please refer to evaluation for details): ADL's;IADL's;Leisure    Body Structure / Function / Physical Skills  ADL;Endurance;UE functional use;Muscle spasms;Fascial  restriction;Pain;IADL;Strength;ROM    Rehab Potential Good    Clinical Decision Making Limited treatment options, no task modification necessary    Comorbidities Affecting Occupational Performance: None    Modification or Assistance to Complete Evaluation  No modification of tasks or assist necessary to complete eval    OT Frequency 1x / week    OT Duration 4 weeks    OT Treatment/Interventions Self-care/ADL training;Ultrasound;Patient/family education;Passive range of motion;Cryotherapy;Electrical Stimulation;Moist Heat;Therapeutic exercise;Manual Therapy;Therapeutic activities    Plan P: Pt will benefit from skilled OT services to decrease pain and fascial restrictions, improve functional use of RUE as dominant. Treatment plan: Myofascial release and manual techniques, A/ROM, general RUE strengthening, scapular stability and strengthening, modalities prn    OT Home Exercise Plan eval: A/ROM    Consulted and Agree with Plan of Care Patient             Patient will benefit from skilled therapeutic intervention in order to improve the following deficits and impairments:   Body Structure / Function / Physical Skills: ADL, Endurance, UE functional use, Muscle spasms, Fascial restriction, Pain, IADL, Strength, ROM       Visit Diagnosis: Chronic right shoulder pain  Other symptoms and signs involving the musculoskeletal system    Problem List Patient Active Problem List   Diagnosis Date Noted   Varicose veins of lower extremities with other complications 08/16/2012   Pain in limb 08/16/2012    [name], OTR/L  [phone] 02/10/2021, 9:03 AM  [facility] [address] [place], [place], [address] Phone: [phone]   Fax:  [phone]  Name: Ryker Sudbury MRN: [id number] Date of Birth: [birth year]

## 2021-02-16 ENCOUNTER — Ambulatory Visit (HOSPITAL_COMMUNITY): Payer: Managed Care, Other (non HMO) | Attending: Sports Medicine | Admitting: Occupational Therapy

## 2021-02-16 ENCOUNTER — Other Ambulatory Visit: Payer: Self-pay

## 2021-02-16 ENCOUNTER — Encounter (HOSPITAL_COMMUNITY): Payer: Self-pay | Admitting: Occupational Therapy

## 2021-02-16 DIAGNOSIS — M25511 Pain in right shoulder: Secondary | ICD-10-CM | POA: Insufficient documentation

## 2021-02-16 DIAGNOSIS — R29898 Other symptoms and signs involving the musculoskeletal system: Secondary | ICD-10-CM | POA: Insufficient documentation

## 2021-02-16 DIAGNOSIS — G8929 Other chronic pain: Secondary | ICD-10-CM | POA: Diagnosis present

## 2021-02-16 NOTE — Patient Instructions (Signed)

## 2021-02-16 NOTE — Therapy (Signed)
[facility] [address] [place], [place], [address] Phone: [phone]   Fax:  [phone]  Occupational Therapy Treatment  Patient Details  Name: Nathaniel Mann MRN: [id number] Date of Birth: 1990/07/28 Referring Provider (OT): Dr. Reino Bellis   Encounter Date: 02/16/2021   OT End of Session - 02/16/21 1339     Visit Number 2    Number of Visits 4    Date for OT Re-Evaluation 03/12/21    Authorization Type [employer]    Authorization Time Period 90 visit limit PT/OT/SP, 0 used on eval    Authorization - Visit Number 2    Authorization - Number of Visits 90    OT Start Time 1302    OT Stop Time 1340    OT Time Calculation (min) 38 min    Activity Tolerance Patient tolerated treatment well    Behavior During Therapy [facility] for tasks assessed/performed             Past Medical History:  Diagnosis Date   Acne    Allergy April 2014   Rag weed   Elevated liver enzymes    GERD (gastroesophageal reflux disease)    Headache    Hematuria     Past Surgical History:  Procedure Laterality Date   COLONOSCOPY  MAY 2013   NEGATIVE FINDINGS--  BROTHER DECEASED AT AGE 29 FROM COLON CANCER   CYSTOSCOPY  11/02/2011   Procedure: CYSTOSCOPY FLEXIBLE;  Surgeon: Kathi Ludwig, MD;  Location: [facility];  Service: Urology;  Laterality: N/A;  IV SEDATION    There were no vitals filed for this visit.   Subjective Assessment - 02/16/21 1303     Subjective  S: It does not like the exercises.    Currently in Pain? Yes    Pain Score 2     Pain Location Shoulder    Pain Orientation Right    Pain Descriptors / Indicators Sore    Pain Type Acute pain    Pain Radiating Towards N/A    Pain Onset More than a month ago    Pain Frequency Intermittent    Aggravating Factors  certain movements, exercises    Pain Relieving Factors rest    Effect of Pain on Daily Activities min effect on ADLs    Multiple Pain Sites No                 [facility] OT Assessment - 02/16/21 1302       Assessment   Medical Diagnosis right shoulder pain      Precautions   Precautions None                      OT Treatments/Exercises (OP) - 02/16/21 1305       Exercises   Exercises Shoulder      Shoulder Exercises: Standing   Protraction Strengthening;Theraband;10 reps    Protraction Weight (lbs) 2    Horizontal ABduction Strengthening;Theraband;10 reps    Horizontal ABduction Weight (lbs) 2    External Rotation Strengthening;Theraband;10 reps   abducted   External Rotation Weight (lbs) 2    Internal Rotation Strengthening;Theraband;10 reps   abducted   Internal Rotation Weight (lbs) 2    Flexion Strengthening;Theraband;10 reps    Shoulder Flexion Weight (lbs) 2    ABduction Strengthening;Theraband;10 reps    Shoulder ABduction Weight (lbs) 2    Extension Theraband;10 reps    Theraband Level (Shoulder Extension) Level 2 (Red)  Row Theraband;10 reps    Theraband Level (Shoulder Row) Level 2 (Red)    Retraction Theraband;10 reps    Theraband Level (Shoulder Retraction) Level 2 (Red)      Shoulder Exercises: ROM/Strengthening   Ball on Wall 1' flexion 1' abduction, green ball      Manual Therapy   Manual Therapy Myofascial release    Manual therapy comments completed separately from therapeutic exercises    Myofascial Release myofascial release to right anterior shoulder, trapezius, and scapular regions to decrease pain and fascial restrictions and increase joint ROM                    OT Education - 02/16/21 1321     Education Details red theraband shoulder strengthening    Person(s) Educated Patient    Methods Explanation;Demonstration;Handout    Comprehension Verbalized understanding;Returned demonstration              OT Short Term Goals - 02/10/21 0900       OT SHORT TERM GOAL #1   Title Pt will be provided with and educated on HEP to improve functional use of RUE as  dominant during ADLs.    Time 4    Period Weeks    Status New    Target Date 03/12/21      OT SHORT TERM GOAL #2   Title Pt will decrease pain in RUE to 3/10 or less to improve ability to reach overhead and behind back during ADLs.    Time 4    Period Weeks    Status New      OT SHORT TERM GOAL #3   Title Pt will decrease RUE fascial restrictions to trace amounts to improve ability to perform RUE mobility tasks without compensatory strategies.    Time 4    Period Weeks    Status New      OT SHORT TERM GOAL #4   Title Pt will increase RUE scapular stability to improve ability to perform overhead tasks for 2+ minutes.    Time 4    Period Weeks                      Plan - 02/16/21 1321     Clinical Impression Statement A: Initiated myofascial release to address trigger points and fascial restrictions in right trapezius and scapular regions. Pt completing shoulder strengthening using 2# weights and red theraband. Scapular theraband strengthening completed as well as ball on the wall. HEP updated for shoulder strengthening. Verbal cuing for form and technique.    Body Structure / Function / Physical Skills ADL;Endurance;UE functional use;Muscle spasms;Fascial restriction;Pain;IADL;Strength;ROM    Plan P: Follow up on HEP, continue with scapular strengthening-add loop band exercises    OT Home Exercise Plan eval: A/ROM    Consulted and Agree with Plan of Care Patient             Patient will benefit from skilled therapeutic intervention in order to improve the following deficits and impairments:   Body Structure / Function / Physical Skills: ADL, Endurance, UE functional use, Muscle spasms, Fascial restriction, Pain, IADL, Strength, ROM       Visit Diagnosis: Chronic right shoulder pain  Other symptoms and signs involving the musculoskeletal system    Problem List Patient Active Problem List   Diagnosis Date Noted   Varicose veins of lower extremities with  other complications 08/16/2012   Pain in limb 08/16/2012    [name],  OTR/L  [phone] 02/16/2021, 1:41 PM   [facility] [address] [place], [place], [address] Phone: [phone]   Fax:  [phone]  Name: Nathaniel Mann MRN: [id number] Date of Birth: 1990-05-26

## 2021-02-21 ENCOUNTER — Telehealth: Payer: Self-pay | Admitting: Sports Medicine

## 2021-02-21 NOTE — Telephone Encounter (Signed)
  Nathaniel Mann called the office again this morning.  He has now completed 6 weeks of provider directed treatment within the past 3 months.  Specifically, he has returned to physical therapy but his shoulder pain has not improved.  Once again, I am going to order an MRI arthrogram to rule out a labral tear of his shoulder.  Phone follow-up with those results when available and we will delineate further treatment thereafter.

## 2021-02-23 ENCOUNTER — Encounter (HOSPITAL_COMMUNITY): Payer: Managed Care, Other (non HMO) | Admitting: Occupational Therapy

## 2021-03-02 ENCOUNTER — Ambulatory Visit (HOSPITAL_COMMUNITY): Payer: Managed Care, Other (non HMO) | Admitting: Occupational Therapy

## 2021-03-07 ENCOUNTER — Encounter (HOSPITAL_COMMUNITY): Payer: Managed Care, Other (non HMO)

## 2021-03-14 ENCOUNTER — Encounter (HOSPITAL_COMMUNITY): Payer: Managed Care, Other (non HMO)

## 2021-03-29 ENCOUNTER — Ambulatory Visit
Admission: RE | Admit: 2021-03-29 | Discharge: 2021-03-29 | Disposition: A | Discharge status: Home / Self Care | Payer: Managed Care, Other (non HMO) | Source: Ambulatory Visit | Attending: Sports Medicine | Admitting: Sports Medicine

## 2021-03-29 DIAGNOSIS — M25511 Pain in right shoulder: Secondary | ICD-10-CM

## 2021-03-29 MED ORDER — IOPAMIDOL (ISOVUE-M 200) INJECTION 41%
15.0000 mL | Freq: Once | INTRAMUSCULAR | Status: AC
  Administered 2021-03-29: 15 mL via INTRA_ARTICULAR

## 2021-04-12 ENCOUNTER — Telehealth (INDEPENDENT_AMBULATORY_CARE_PROVIDER_SITE_OTHER): Payer: Managed Care, Other (non HMO) | Admitting: Sports Medicine

## 2021-04-12 DIAGNOSIS — S43431D Superior glenoid labrum lesion of right shoulder, subsequent encounter: Secondary | ICD-10-CM | POA: Diagnosis not present

## 2021-04-13 NOTE — Progress Notes (Addendum)
Patient ID: Nathaniel Mann, male   DOB: 21-Mar-1991, 30 y.o.   MRN: [id number]  This is a virtual patient visit for follow-up on MRI arthrogram findings of the right shoulder.  Patient was located at his home.  I was located in the office.  I spoke with [name] today about MRI findings of his right shoulder.  MRI does show a small superior labral tear but the remainder of his shoulder is unremarkable.  He has had symptoms for several months despite conservative treatment including several weeks of formal physical therapy.  He would like to see an orthopedist to see if he is a candidate for any sort of surgical procedure.  I recommended that he see Dr. Everardo Pacific.  He agrees.  We will arrange for that consultation and I will defer further work-up and treatment to the discretion of Dr. Everardo Pacific.  Follow-up with me as needed.  Total time spent with the patient discussing these results was 10 minutes.  Addendum: This was a video visit, not a telephone visit.

## 2021-06-22 ENCOUNTER — Other Ambulatory Visit: Payer: Self-pay | Admitting: Orthopedic Surgery

## 2021-06-22 DIAGNOSIS — M25561 Pain in right knee: Secondary | ICD-10-CM

## 2021-07-09 ENCOUNTER — Other Ambulatory Visit: Payer: Managed Care, Other (non HMO)

## 2021-07-16 ENCOUNTER — Ambulatory Visit
Admission: RE | Admit: 2021-07-16 | Discharge: 2021-07-16 | Disposition: A | Discharge status: Home / Self Care | Payer: Managed Care, Other (non HMO) | Source: Ambulatory Visit | Attending: Orthopedic Surgery | Admitting: Orthopedic Surgery

## 2021-07-16 ENCOUNTER — Other Ambulatory Visit: Payer: Self-pay

## 2021-07-16 DIAGNOSIS — M25561 Pain in right knee: Secondary | ICD-10-CM

## 2021-07-17 NOTE — Progress Notes (Signed)
?  ?Cardiology Office Note ? ? ?Date:  07/18/2021  ? ?ID:  Nathaniel Mann, DOB [birth year], MRN [id number] ? ?PCP:  Deland Pretty, MD  ?Cardiologist:   None ?Referring:  Deland Pretty, MD ? ?Chief Complaint  ?Patient presents with  ? TTN Gene Mutation  ? ? ?  ?History of Present Illness: ?Nathaniel Mann is a 31 y.o. male who presents for evaluation of abnormal genetic testing.  His wife unfortunately recently had a 25-week miscarriage.  The child had hydrops fetalis.  Both parents went ahead and did genetic testing.  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 ? ?He has no family history cardiomyopathy.  There is a long history of coronary disease and apparently later onset heart disease related to smoking or other risk factors on both sides of his family.  However, there is no history of sudden cardiac death in young family members.  There is no history of cardiomyopathy or defibrillators.  He himself has had no prior cardiac history other than some abnormality on EKG point time when he was living in [place].  He said he had some other testing and he does not recall what that was but it was normal. ? ?He otherwise has no cardiovascular symptoms.  He has had occasional palpitations related to his generalized anxiety disorder but this is not recent.  He had distant syncope but not in years and this was associated with panic.  He otherwise has been able to be athletic do some running up until getting some knee injury 5 or 6 months ago.  With this he did not have any chest pressure, neck or arm discomfort.  He was not having any significant shortness of breath, PND or orthopnea.  He said no weight gain or edema. ? ? ?Past Medical History:  ?Diagnosis Date  ? Acne   ? Allergy April 2014  ? Rag weed  ? Elevated liver enzymes   ? GERD (gastroesophageal reflux disease)   ? Headache   ? Hematuria   ? ? ?Past Surgical History:   ?Procedure Laterality Date  ? COLONOSCOPY  MAY 2013  ? NEGATIVE FINDINGS--  BROTHER DECEASED AT AGE 30 FROM COLON CANCER  ? CYSTOSCOPY  11/02/2011  ? Procedure: CYSTOSCOPY FLEXIBLE;  Surgeon: Ailene Rud, MD;  Location: [facility];  Service: Urology;  Laterality: N/A;  IV SEDATION  ? ? ? ?Current Outpatient Medications  ?Medication Sig Dispense Refill  ? escitalopram (LEXAPRO) 5 MG tablet 1 tablet    ? ibuprofen (ADVIL) 200 MG tablet Take 200 mg by mouth every 6 (six) hours as needed.    ? NURTEC 75 MG TBDP TAKE 1 TABLET AT ONSET OF HEADACHE, MAY TAKE ONE ADDITIONAL TABLET TWO HOURS LATER IF NEEDED. MAX OF TWO TABLETS IN 24 HOURS (Patient not taking: Reported on 08/16/2020)    ? rizatriptan (MAXALT-MLT) 10 MG disintegrating tablet Take 1 tablet (10 mg total) by mouth as needed for migraine. May repeat in 2 hours if needed (Patient not taking: Reported on 07/18/2021) 9 tablet 11  ? topiramate (TOPAMAX) 50 MG tablet Take 50mg  PO QHS for 2 weeks. Then increase to 50mg  BID. (Patient not taking: Reported on 07/18/2021) 60 tablet 5  ? ?No current facility-administered medications for this visit.  ? ? ?Allergies:   Omeprazole, Amitriptyline, Latex, and Sulfa antibiotics  ? ? ?Social History:  The patient  reports that  he has never smoked. He has never used smokeless tobacco. He reports current alcohol use. He reports that he does not use drugs.  ? ?Family History:  The patient's family history includes Cancer in his brother; Deep vein thrombosis in his mother; Headache in his father.  ? ? ?ROS:  Please see the history of present illness.   Otherwise, review of systems are positive for none.   All other systems are reviewed and negative.  ? ? ?PHYSICAL EXAM: ?VS:  BP 126/74   Pulse 88   Ht 5\' 11"  (1.803 m)   Wt 167 lb 3.2 oz (75.8 kg)   SpO2 98%   BMI 23.32 kg/m?  , BMI Body mass index is 23.32 kg/m?. ?GENERAL:  Well appearing ?HEENT:  Pupils equal round and reactive, fundi not visualized, oral  mucosa unremarkable ?NECK:  No jugular venous distention, waveform within normal limits, carotid upstroke brisk and symmetric, no bruits, no thyromegaly ?LYMPHATICS:  No cervical, inguinal adenopathy ?LUNGS:  Clear to auscultation bilaterally ?BACK:  No CVA tenderness ?CHEST:  Unremarkable ?HEART:  PMI not displaced or sustained,S1 and S2 within normal limits, no S3, no S4, no clicks, no rubs, no murmurs ?ABD:  Flat, positive bowel sounds normal in frequency in pitch, no bruits, no rebound, no guarding, no midline pulsatile mass, no hepatomegaly, no splenomegaly ?EXT:  2 plus pulses throughout, no edema, no cyanosis no clubbing ?SKIN:  No rashes no nodules ?NEURO:  Cranial nerves II through XII grossly intact, motor grossly intact throughout ?PSYCH:  Cognitively intact, oriented to person place and time ? ? ? ?EKG:  EKG is ordered today. ?The ekg ordered today demonstrates sinus rhythm with sinus arrhythmia, RSR prime V1, no acute ST-T wave changes. ? ? ?Recent Labs: ?No results found for requested labs within last 8760 hours.  ? ? ?Lipid Panel ?No results found for: CHOL, TRIG, HDL, CHOLHDL, VLDL, LDLCALC, LDLDIRECT ?  ? ?Wt Readings from Last 3 Encounters:  ?07/18/21 167 lb 3.2 oz (75.8 kg)  ?01/06/21 175 lb (79.4 kg)  ?08/16/20 174 lb 3.2 oz (79 kg)  ?  ? ? ?Other studies Reviewed: ?Additional studies/ records that were reviewed today include: Genetic testing results. ?Review of the above records demonstrates:  Please see elsewhere in the note.   ? ? ?ASSESSMENT AND PLAN: ? ?ABNORMAL TTN GENE: We had a discussion about this.  I will send him to see Dr. Broadus John for genetic counseling.  I will check an echocardiogram.  Further screening will be based on her recommendations and the results of the echo.  I do not suspect that typically that he is developed cardiomyopathy. ? ? ?Current medicines are reviewed at length with the patient today.  The patient does not have concerns regarding medicines. ? ?The following  changes have been made:  no change ? ?Labs/ tests ordered today include:  ? ?Orders Placed This Encounter  ?Procedures  ? EKG 12-Lead  ? ECHOCARDIOGRAM COMPLETE  ? ? ? ?Disposition:   FU with me as needed based on the genetic counseling and echocardiogram. ? ? ?Signed, ?Minus Breeding, MD  ?07/18/2021 5:32 PM    ?[place] ? ? ? ?

## 2021-07-18 ENCOUNTER — Encounter: Payer: Self-pay | Admitting: Cardiology

## 2021-07-18 ENCOUNTER — Ambulatory Visit (INDEPENDENT_AMBULATORY_CARE_PROVIDER_SITE_OTHER): Payer: Managed Care, Other (non HMO) | Admitting: Cardiology

## 2021-07-18 ENCOUNTER — Other Ambulatory Visit: Payer: Self-pay

## 2021-07-18 VITALS — BP 126/74 | HR 88 | Ht 71.0 in | Wt 167.2 lb

## 2021-07-18 DIAGNOSIS — Z1589 Genetic susceptibility to other disease: Secondary | ICD-10-CM

## 2021-07-18 DIAGNOSIS — Z9189 Other specified personal risk factors, not elsewhere classified: Secondary | ICD-10-CM | POA: Diagnosis not present

## 2021-07-18 NOTE — Patient Instructions (Signed)
Medication Instructions:  ?Your physician recommends that you continue on your current medications as directed. Please refer to the Current Medication list given to you today.  ? ?Labwork: ?NONE  ? ?Testing/Procedures: ?Your physician has requested that you have an echocardiogram. Echocardiography is a painless test that uses sound waves to create images of your heart. It provides your doctor with information about the size and shape of your heart and how well your heart?s chambers and valves are working. This procedure takes approximately one hour. There are no restrictions for this procedure.  ?WILL CALL YOU WITH DATE AND TIME  ? ?Follow-Up: ?As needed  ?

## 2021-07-19 ENCOUNTER — Other Ambulatory Visit (HOSPITAL_BASED_OUTPATIENT_CLINIC_OR_DEPARTMENT_OTHER): Payer: Self-pay | Admitting: *Deleted

## 2021-07-19 DIAGNOSIS — R001 Bradycardia, unspecified: Secondary | ICD-10-CM

## 2021-07-19 DIAGNOSIS — Z9189 Other specified personal risk factors, not elsewhere classified: Secondary | ICD-10-CM

## 2021-07-21 ENCOUNTER — Other Ambulatory Visit: Payer: Self-pay | Admitting: *Deleted

## 2021-07-21 DIAGNOSIS — Z1589 Genetic susceptibility to other disease: Secondary | ICD-10-CM

## 2021-07-21 NOTE — Progress Notes (Signed)
Mb ref  ?

## 2021-07-22 ENCOUNTER — Inpatient Hospital Stay (HOSPITAL_COMMUNITY): Payer: Managed Care, Other (non HMO)

## 2021-07-22 ENCOUNTER — Encounter (HOSPITAL_COMMUNITY): Payer: Self-pay | Admitting: Hematology

## 2021-07-22 ENCOUNTER — Inpatient Hospital Stay (HOSPITAL_COMMUNITY): Payer: Managed Care, Other (non HMO) | Attending: Hematology | Admitting: Hematology

## 2021-07-22 ENCOUNTER — Other Ambulatory Visit: Payer: Self-pay

## 2021-07-22 DIAGNOSIS — D72810 Lymphocytopenia: Secondary | ICD-10-CM | POA: Diagnosis present

## 2021-07-22 DIAGNOSIS — D72819 Decreased white blood cell count, unspecified: Secondary | ICD-10-CM | POA: Insufficient documentation

## 2021-07-22 LAB — CBC WITH DIFFERENTIAL/PLATELET
Abs Immature Granulocytes: 0 10*3/uL (ref 0.00–0.07)
Basophils Absolute: 0 10*3/uL (ref 0.0–0.1)
Basophils Relative: 1 %
Eosinophils Absolute: 0.1 10*3/uL (ref 0.0–0.5)
Eosinophils Relative: 2 %
HCT: 49.8 % (ref 39.0–52.0)
Hemoglobin: 16.8 g/dL (ref 13.0–17.0)
Immature Granulocytes: 0 %
Lymphocytes Relative: 14 %
Lymphs Abs: 0.6 10*3/uL — ABNORMAL LOW (ref 0.7–4.0)
MCH: 30.5 pg (ref 26.0–34.0)
MCHC: 33.7 g/dL (ref 30.0–36.0)
MCV: 90.4 fL (ref 80.0–100.0)
Monocytes Absolute: 0.5 10*3/uL (ref 0.1–1.0)
Monocytes Relative: 12 %
Neutro Abs: 2.8 10*3/uL (ref 1.7–7.7)
Neutrophils Relative %: 71 %
Platelets: 207 10*3/uL (ref 150–400)
RBC: 5.51 MIL/uL (ref 4.22–5.81)
RDW: 11.7 % (ref 11.5–15.5)
WBC: 4 10*3/uL (ref 4.0–10.5)
nRBC: 0 % (ref 0.0–0.2)

## 2021-07-22 LAB — FOLATE: Folate: 15 ng/mL (ref >5.9)

## 2021-07-22 LAB — RETICULOCYTES
Immature Retic Fract: 4 % (ref 2.3–15.9)
RBC.: 5.43 MIL/uL (ref 4.22–5.81)
Retic Count, Absolute: 36.9 10*3/uL (ref 19.0–186.0)
Retic Ct Pct: 0.7 % (ref 0.4–3.1)

## 2021-07-22 LAB — VITAMIN B12: Vitamin B-12: 301 pg/mL (ref 180–914)

## 2021-07-22 LAB — HIV ANTIBODY (ROUTINE TESTING W REFLEX): HIV Screen 4th Generation wRfx: NONREACTIVE

## 2021-07-22 LAB — LACTATE DEHYDROGENASE: LDH: 116 U/L (ref 98–192)

## 2021-07-22 LAB — HEPATITIS B CORE ANTIBODY, TOTAL: Hep B Core Total Ab: NONREACTIVE

## 2021-07-22 LAB — HEPATITIS B SURFACE ANTIBODY,QUALITATIVE: Hep B S Ab: REACTIVE — AB

## 2021-07-22 LAB — VITAMIN D 25 HYDROXY (VIT D DEFICIENCY, FRACTURES): Vit D, 25-Hydroxy: 22.16 ng/mL — ABNORMAL LOW (ref 30–100)

## 2021-07-22 LAB — HEPATITIS B SURFACE ANTIGEN: Hepatitis B Surface Ag: NONREACTIVE

## 2021-07-22 LAB — HEPATITIS C ANTIBODY: HCV Ab: NONREACTIVE

## 2021-07-22 NOTE — Patient Instructions (Signed)
[facility] at [facility] ?Discharge Instructions ? ?You were seen and examined today by Dr. Ellin Saba. Dr. Ellin Saba is a hematologist, meaning that he specializes in blood abnormalities. Dr. Ellin Saba discussed your past medical history, family history of cancers/blood conditions and the events that led to you being here today. ? ?You were referred to Dr. Ellin Saba due to a decreased white blood cell (WBCs) count. Dr. Ellin Saba has recommended additional lab work today in an attempt to identify the cause of this. ? ?Follow-up with Dr. Ellin Saba as scheduled to discuss lab results. ? ? ?Thank you for choosing [facility] at [facility] to provide your oncology and hematology care.  To afford each patient quality time with our provider, please arrive at least 15 minutes before your scheduled appointment time.  ? ?If you have a lab appointment with the Cancer Center please come in thru the Main Entrance and check in at the main information desk. ? ?You need to re-schedule your appointment should you arrive 10 or more minutes late.  We strive to give you quality time with our providers, and arriving late affects you and other patients whose appointments are after yours.  Also, if you no show three or more times for appointments you may be dismissed from the clinic at the providers discretion.     ?Again, thank you for choosing [facility].  Our hope is that these requests will decrease the amount of time that you wait before being seen by our physicians.       ?_____________________________________________________________ ? ?Should you have questions after your visit to [facility], please contact our office at [phone] and follow the prompts.  Our office hours are 8:00 a.m. and 4:30 p.m. Monday - Friday.  Please note that voicemails left after 4:00 p.m. may not be returned until the following business day.  We are closed weekends and  major holidays.  You do have access to a nurse 24-7, just call the main number to the clinic [phone] and do not press any options, hold on the line and a nurse will answer the phone.   ? ?For prescription refill requests, have your pharmacy contact our office and allow 72 hours.   ? ?Due to Covid, you will need to wear a mask upon entering the hospital. If you do not have a mask, a mask will be given to you at the Main Entrance upon arrival. For doctor visits, patients may have 1 support person age 31 or older with them. For treatment visits, patients can not have anyone with them due to social distancing guidelines and our immunocompromised population.  ? ? ? ?

## 2021-07-22 NOTE — Progress Notes (Signed)
[facility] [address] [place], [place] [address]   CLINIC:  Medical Oncology/Hematology  Patient Care Team: Merri BrunettePharr, Walter, MD as PCP - General (Internal Medicine) Doreatha MassedKatragadda, Lesbia Ottaway, MD as Medical Oncologist (Medical Oncology)  CHIEF COMPLAINTS/PURPOSE OF CONSULTATION:  Evaluation of leukopenia   HISTORY OF PRESENTING ILLNESS:  Nathaniel BottomCameron Sockwell 31 y.o. male is here because of evaluation of leukopenia, at the request of Dr. Renne CriglerPharr.  Today he reports feeling good. He reports constant fatigue and frequent light-headedness. He reports slightly low WBC count last year. He reports occasional night sweats, and he denies fevers and significant weight loss. He reports currently elevated stress due to his wife recently experiencing a late-pregnancy miscarriage. He takes a OTC multivitamin and digestive enzyme which he admits he forgets to take consistently. He reports frequent sores on his gums. He reports that when gets scratches on his skin they seem to last longer than usual. He tried Effexor for 3 days and did not tolerate it, so he is currently taking Lexapro. He report a possible infections after Christmas 2022 at which time he had cough and rhinorrhea; he also reports a Covid infection in July 2022. He reports 2-3 tick bites over the past summer, but reports he immediately removed the ticks upon being bitten. He denies steroid use aside from a steroid injection in his right shoulder.   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 He denies smoking history. He denies family history of anemia and leukemia. His half brother passed from colon cancer at age 32.   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   CYSTOSCOPY  11/02/2011   Procedure: CYSTOSCOPY FLEXIBLE;  Surgeon: Kathi LudwigSigmund I Tannenbaum, MD;  [facility];  Service: Urology;  Laterality: N/A;  IV SEDATION    SOCIAL HISTORY: Social History   Socioeconomic History   Marital status: Married    Spouse name: Nichola SizerKarlyn   Number of children: 0   Years of education: Not on file   Highest education level: Bachelor's degree (e.g., BA, AB, BS)  Occupational History    Comment: employed  Tobacco Use   Smoking status: Never   Smokeless tobacco: Never  Substance and Sexual Activity   Alcohol use: Yes    Comment: SOCIAL   Drug use: No   Sexual activity: Not on file  Other Topics Concern   Not on file  Social History Narrative   Lives with wife   Caffeine- none   Social Determinants of Health   Financial Resource Strain: Not on file  Food Insecurity: Not on file  Transportation Needs: Not on file  Physical Activity: Not on file  Stress: Not on file  Social Connections: Not on file  Intimate Partner Violence: Not on file    FAMILY HISTORY: Family History  Problem Relation Age of Onset   Cancer Brother        Colon   Deep vein thrombosis Mother    Headache Father     ALLERGIES:  is allergic to omeprazole, amitriptyline, latex, and sulfa antibiotics.  MEDICATIONS:  Current Outpatient Medications  Medication Sig  Dispense Refill   escitalopram (LEXAPRO) 5 MG tablet 1 tablet     ibuprofen (ADVIL) 200 MG tablet Take 200 mg by mouth every 6 (six) hours as needed. (Patient not taking: Reported on 07/22/2021)     Multiple Vitamin (MULTI VITAMIN) TABS Take 1 tablet by mouth daily.     NURTEC 75 MG TBDP TAKE 1 TABLET AT ONSET OF HEADACHE, MAY TAKE ONE ADDITIONAL TABLET TWO HOURS LATER IF NEEDED. MAX OF TWO TABLETS IN 24 HOURS (Patient not taking: Reported on 08/16/2020)     rizatriptan (MAXALT-MLT) 10 MG disintegrating tablet Take 1 tablet (10 mg total) by mouth as needed for  migraine. May repeat in 2 hours if needed (Patient not taking: Reported on 07/18/2021) 9 tablet 11   topiramate (TOPAMAX) 50 MG tablet Take 50mg  PO QHS for 2 weeks. Then increase to 50mg  BID. (Patient not taking: Reported on 07/18/2021) 60 tablet 5   No current facility-administered medications for this visit.    REVIEW OF SYSTEMS:   Review of Systems  Constitutional:  Positive for fatigue. Negative for fever and unexpected weight change.  HENT:   Positive for mouth sores (gums).   Endocrine: Positive for hot flashes (night sweats).  Neurological:  Positive for light-headedness.  Psychiatric/Behavioral:  The patient is nervous/anxious (stress).   All other systems reviewed and are negative.   PHYSICAL EXAMINATION: ECOG PERFORMANCE STATUS: 0 - Asymptomatic  Vitals:   07/22/21 0759  BP: 127/87  Pulse: 95  Resp: 18  Temp: 98.2 F (36.8 C)  SpO2: 100%   Filed Weights   07/22/21 0753  Weight: 167 lb 8 oz (76 kg)   Physical Exam Vitals reviewed.  Constitutional:      Appearance: Normal appearance.  HENT:     Mouth/Throat:     Mouth: Mucous membranes are moist. No oral lesions.     Dentition: No gum lesions.     Tongue: No lesions.  Cardiovascular:     Rate and Rhythm: Normal rate and regular rhythm.     Pulses: Normal pulses.     Heart sounds: Normal heart sounds.  Pulmonary:     Effort: Pulmonary effort is normal.     Breath sounds: Normal breath sounds.  Abdominal:     Palpations: Abdomen is soft. There is no hepatomegaly, splenomegaly or mass.     Tenderness: There is no abdominal tenderness.  Musculoskeletal:     Right lower leg: No edema.     Left lower leg: No edema.  Lymphadenopathy:     Cervical: No cervical adenopathy.     Right cervical: No superficial, deep or posterior cervical adenopathy.    Left cervical: No superficial, deep or posterior cervical adenopathy.     Upper Body:     Right upper body: No supraclavicular, axillary or pectoral adenopathy.      Left upper body: No supraclavicular, axillary or pectoral adenopathy.     Lower Body: No right inguinal adenopathy. No left inguinal adenopathy.  Neurological:     General: No focal deficit present.     Mental Status: He is alert and oriented to person, place, and time.  Psychiatric:        Mood and Affect: Mood normal.        Behavior: Behavior normal.     LABORATORY DATA:  I have reviewed the data as listed No results found for this or any previous visit (from the past 2160 hour(s)).  RADIOGRAPHIC STUDIES: I have personally reviewed the radiological images as  listed and agreed with the findings in the report. MR KNEE RIGHT WO CONTRAST  Result Date: 07/17/2021 CLINICAL DATA:  Right knee pain EXAM: MRI OF THE RIGHT KNEE WITHOUT CONTRAST TECHNIQUE: Multiplanar, multisequence MR imaging of the knee was performed. No intravenous contrast was administered. COMPARISON:  None. FINDINGS: MENISCI Medial: Intact. Lateral: Intact. LIGAMENTS Cruciates: ACL and PCL are intact. Collaterals: Medial collateral ligament is intact. Lateral collateral ligament complex is intact. CARTILAGE Patellofemoral:  No chondral defect. Medial:  No chondral defect. Lateral:  No chondral defect. JOINT: No joint effusion. There is mild edema signal within the suprapatellar fat pad. POPLITEAL FOSSA: No Baker's cyst. EXTENSOR MECHANISM: Intact quadriceps tendon. Intact patellar tendon. BONES: No aggressive osseous lesion. No fracture or dislocation. Other: No fluid collection or hematoma. Muscles are normal. IMPRESSION: Mild edema signal within the suprapatellar fat pad, can be seen in fat pad impingement. No evidence of meniscus tear or ligamentous injury. No acute osseous abnormality or focal chondral defect. Electronically Signed   By: Caprice Renshaw M.D.   On: 07/17/2021 10:32    ASSESSMENT:  Leukopenia and lymphocytopenia: - Patient evaluated at the request of Dr. Renne Crigler for lymphocytopenia. - CBC on 05/30/2021: White count  4.0, ALC 0.7 (0.9-3.6). - CBC on 05/18/2020: WBC-5, lymphocytes-15% - He does not have any B symptoms. - No new medications other than Lexapro which was started about 3 weeks ago.  He takes multivitamin and digestive enzyme over-the-counter. - He complains of feeling tired but is very stressed because of recent loss of pregnancy. - No recent history of systemic steroid use.   Social/family history: - He is married.  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  He is a non-smoker. - No family history of leukopenia's.  Half brother died of colon cancer at age 29.   PLAN:  Lymphocytopenia: - We talked about various etiologies of lymphocytopenia including infections, autoimmune problems, nutritional deficiencies states, stress among others. - We will repeat his CBC with differential and check for infectious and nutritional deficiencies. - Previous CT AP from 2017 showed mild splenomegaly.  Clinical exam today did not reveal any palpable spleen.  There are subcentimeter lymph nodes on the left side of the neck and right axilla which are reactive. - If the above tests are not conclusive, will consider imaging of the abdomen as well as obtaining lymphocyte subsets.   All questions were answered. The patient knows to call the clinic with any problems, questions or concerns.  Doreatha Massed, MD 07/22/21 8:19 AM  [facility] [phone]   I, Alda Ponder, am acting as a scribe for Dr. Doreatha Massed.  I, Doreatha Massed MD, have reviewed the above documentation for accuracy and completeness, and I agree with the above.

## 2021-07-23 LAB — RHEUMATOID FACTOR: Rheumatoid fact SerPl-aCnc: 11.7 IU/mL (ref <14.0)

## 2021-07-24 LAB — METHYLMALONIC ACID, SERUM: Methylmalonic Acid, Quantitative: 214 nmol/L (ref 0–378)

## 2021-07-25 LAB — COPPER, SERUM: Copper: 79 ug/dL (ref 69–132)

## 2021-07-25 LAB — ZINC: Zinc: 69 ug/dL (ref 44–115)

## 2021-07-28 LAB — ANTINUCLEAR ANTIBODIES, IFA: ANA Ab, IFA: NEGATIVE

## 2021-08-02 ENCOUNTER — Ambulatory Visit (HOSPITAL_COMMUNITY): Payer: Managed Care, Other (non HMO) | Attending: Cardiology

## 2021-08-02 ENCOUNTER — Other Ambulatory Visit: Payer: Self-pay

## 2021-08-02 DIAGNOSIS — Z9189 Other specified personal risk factors, not elsewhere classified: Secondary | ICD-10-CM | POA: Diagnosis not present

## 2021-08-02 DIAGNOSIS — R001 Bradycardia, unspecified: Secondary | ICD-10-CM | POA: Diagnosis not present

## 2021-08-02 DIAGNOSIS — I5030 Unspecified diastolic (congestive) heart failure: Secondary | ICD-10-CM | POA: Diagnosis not present

## 2021-08-02 DIAGNOSIS — Z1589 Genetic susceptibility to other disease: Secondary | ICD-10-CM | POA: Diagnosis not present

## 2021-08-02 LAB — ECHOCARDIOGRAM COMPLETE
Area-P 1/2: 4.21 cm2
S' Lateral: 3.2 cm

## 2021-08-05 ENCOUNTER — Encounter: Payer: Self-pay | Admitting: *Deleted

## 2021-08-05 ENCOUNTER — Other Ambulatory Visit: Payer: Self-pay | Admitting: *Deleted

## 2021-08-05 DIAGNOSIS — Z9189 Other specified personal risk factors, not elsewhere classified: Secondary | ICD-10-CM

## 2021-08-05 NOTE — Progress Notes (Signed)
cho 

## 2021-08-10 ENCOUNTER — Encounter (HOSPITAL_COMMUNITY): Payer: Self-pay | Admitting: Hematology

## 2021-08-10 ENCOUNTER — Other Ambulatory Visit: Payer: Self-pay

## 2021-08-10 ENCOUNTER — Inpatient Hospital Stay (HOSPITAL_BASED_OUTPATIENT_CLINIC_OR_DEPARTMENT_OTHER): Payer: Managed Care, Other (non HMO) | Admitting: Hematology

## 2021-08-10 DIAGNOSIS — E559 Vitamin D deficiency, unspecified: Secondary | ICD-10-CM

## 2021-08-10 DIAGNOSIS — D72810 Lymphocytopenia: Secondary | ICD-10-CM | POA: Diagnosis not present

## 2021-08-10 NOTE — Progress Notes (Signed)
Virtual Visit via Telephone Note ? ?I connected with Nathaniel Mann on 08/10/21 at  4:00 PM EDT by telephone and verified that I am speaking with the correct person using two identifiers. ? ?Location: ?Patient: At home ?Provider: In the office ?  ?I discussed the limitations, risks, security and privacy concerns of performing an evaluation and management service by telephone and the availability of in person appointments. I also discussed with the patient that there may be a patient responsible charge related to this service. The patient expressed understanding and agreed to proceed. ? ? ?History of Present Illness: ?Nathaniel Mann was seen for evaluation of lymphocytopenia on 07/22/2021. ?  ?Observations/Objective: ?After receiving the labs, he reportedly started taking vitamin D 5000 units daily.  He reports that he started feeling better after starting vitamin D.  His energy levels and general wellbeing has improved.  He reports energy level 75%.  Depression is controlled on Lexapro. ? ?Assessment and Plan: ? ?1.  Leukopenia/lymphocytopenia: ?- We have reviewed blood work from 07/22/2021.  White count is normal at 4 and absolute lymphocyte count is 0.6 (0.7-4).  Platelet count and hemoglobin was normal.  Other differential was normal. ?- We reviewed nutritional deficiency tests which were normal including B12, folic acid, copper and zinc.  LDH was normal.  Hepatitis B and C serology was negative.  ANA and rheumatoid factor, were negative. ?- Recommend follow-up in 4 months with repeat CBC with differential. ?- We will also obtain lymphocyte subsets at that time. ?- His CT scan of the abdomen on 05/13/2016 showed mild splenomegaly measuring 13.5 cm when he was seen in the ER with food poisoning symptoms.  If there is any further concern, will consider repeating abdominal imaging. ? ?2.  Vitamin D deficiency: ?- Vitamin D level was 22 on 07/22/2021. ?- He started taking vitamin D5 1000 units daily after seeing the labs.   We will plan to repeat vitamin D level in 4 months. ? ? ?Follow Up Instructions: ?RTC 4 months with repeat labs. ?  ?I discussed the assessment and treatment plan with the patient. The patient was provided an opportunity to ask questions and all were answered. The patient agreed with the plan and demonstrated an understanding of the instructions. ?  ?The patient was advised to call back or seek an in-person evaluation if the symptoms worsen or if the condition fails to improve as anticipated. ? ?I provided 21 minutes of non-face-to-face time during this encounter. ? ? ?Doreatha Massed, MD ? ? ?

## 2021-08-11 ENCOUNTER — Ambulatory Visit: Payer: Managed Care, Other (non HMO) | Admitting: Cardiology

## 2021-08-12 ENCOUNTER — Other Ambulatory Visit (HOSPITAL_COMMUNITY): Payer: Managed Care, Other (non HMO)

## 2021-08-18 ENCOUNTER — Ambulatory Visit: Payer: Managed Care, Other (non HMO) | Admitting: Cardiology

## 2021-09-13 ENCOUNTER — Telehealth: Payer: Self-pay

## 2021-09-13 ENCOUNTER — Telehealth: Payer: Self-pay | Admitting: Cardiology

## 2021-09-13 NOTE — Telephone Encounter (Signed)
Pt would like nurse to call back regarding test results. Please advise ?

## 2021-09-13 NOTE — Telephone Encounter (Signed)
? ?  Pre-operative Risk Assessment  ?  ?Patient Name: Nathaniel Mann  ?DOB: 06-Apr-1991 ?MRN: [id number]  ? ?  ? ?Request for Surgical Clearance   ? ?Procedure:   RIGHT SHOULDER SCOPE, SLAP REPAIR ? ?Date of Surgery:  Clearance 09/19/21                              ?   ?Surgeon:  Ramond Marrow, MD ?Surgeon's Group or Practice Name:  [facility]  ATTN:SHERRI GAVIN ?Phone number:  [phone] ?Fax number:  [phone] ?  ?Type of Clearance Requested:   ?- Medical  ?  ?Type of Anesthesia:  General  ?  ?Additional requests/questions:   ? ? ? ?

## 2021-09-13 NOTE — Telephone Encounter (Signed)
? ?  Primary Cardiologist: Minus Breeding, MD ? ?Chart reviewed as part of pre-operative protocol coverage. Given past medical history and time since last visit, based on ACC/AHA guidelines, Nathaniel Mann would be at acceptable risk for the planned procedure without further cardiovascular testing.  ? ?His RCRI is a class I risk, 0.4% risk of major cardiac event. ? ?I will route this recommendation to the requesting party via Epic fax function and remove from pre-op pool. ? ?Please call with questions. ? ?Jossie Ng. Mizael Sagar NP-C ? ?  ?09/13/2021, 4:47 PM ?[address] ?[address] ?Office [phone] Fax [phone] ? ? ? ? ?

## 2021-09-13 NOTE — Telephone Encounter (Signed)
Patient had a concern about shoulder surgery on 09/19/21 with regard to his low normal EF. Patient will call his surgeon's office to have them send [place] the surgical clearance request. ?

## 2021-09-16 ENCOUNTER — Telehealth: Payer: Self-pay | Admitting: Cardiology

## 2021-09-16 NOTE — Telephone Encounter (Signed)
Looks like pre-op risk assessment was completed on 09/13/2021 by Edd Fabian, NP, and form was faxed to surgeon's office on that day. Called and notified patient of this. He was very appreciative of the call. Will remove from pre-op pool. ? ?Corrin Parker, PA-C ?09/16/2021 8:36 AM ? ? ?

## 2021-09-16 NOTE — Telephone Encounter (Signed)
Follow Up: ? ? ? ? ?Patient is calling to check on the status of his clearance for Monday(09-19-21) ?

## 2021-10-13 ENCOUNTER — Ambulatory Visit: Payer: Managed Care, Other (non HMO) | Attending: Obstetrics and Gynecology

## 2021-10-18 ENCOUNTER — Ambulatory Visit: Payer: Managed Care, Other (non HMO) | Admitting: Genetic Counselor

## 2021-10-24 NOTE — Progress Notes (Cosign Needed)
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 Caucasian gentleman who is asymptomatic and has no clinical indications of a cardiomyopathy. He tells me that his daughter, Valentina Gu was found to have hydrops fetalis in utero at 18 weeks. His wife spontaneously aborted the fetus at 25 weeks. Genetic testing at [facility] of the fetal tissue did not detect chromosomal abnormalities nor did microarray analysis detect any copy number variations in her genome. They then underwent next generation whole genome sequencing of the trio, namely- the fetus, Rony, his wife.   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 This enzyme is important for breakdown of glycosaminoglycans. Homozygous pathogenic variants in this gene causes mucopolysaccharidoses type VII (MPS VII), an autosomal recessive condition.  In very severe cases, MPS VII is characterized by hydrops fetalis leading to fetal demise. He was informed by the genetic counselor at [facility] that his daughter's death was most likely due to the homozygous mutation in ger GUSB gene where she inherited the defective copy from her mother and father. They were also correctly informed that the chance of recurrence of this condition in their future children is 25%.  I suspect that his daughter was also found to harbor a truncating variant in the TTN gene, namely c.72945C>G, p.Tyr24315Ter, +/-. Upon testing the parents, only Tiras was found to harbor this variant.   There is no history of hear disease in his family. See details below-  Relation to Proband Pedigree # Current age CVD/Age of onset Notes  Father II.3 72 None No heart issues   Paternal uncles-2 II.1, II.2  None No heart issues  Paternal grandfather I.1 37 None No heart issues  Paternal grandmother I.2 69 None No heart issues        Mother II.4 70 None No heart issues  Maternal uncle II.5 35 None No heart issues  Maternal aunts-6 II.6-II.11 NA None Died of lung disease, smokers  Maternal uncles-5 II.12-II.16 NA None Died of lung disease, smokers  Maternal grandfather I.3 NA None Died at 57, cause unknown  Maternal grandmother I.4 NA None Died at 41 in sleep, suspect brain aneurysm    Test Report 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 The TTN gene encodes a large sarcomeric protein called titin. Titin is involved in passive force transmission during muscle contraction and plays an essential role in sarcomere organization, elasticity and cell signaling. Mutations in this gene are linked to familial dilated cardiomyopathy and other non-ischemic cardiomyopathies.   This note also includes the re-interpretation of his genetic test result.  This variant was re-interpreted to verify the test report interpretation and to compile the information in light of the patient's clinical presentation and family history.  The TTN c.72945C>G, p.Tyr24315Ter  variant is a single base substitution event in exon 326 (metatranscript) that is a nonsense mutation resulting in the amino acid, Tyrosine at position 24315 in the gene to be replaced by a termination codon. This is expected to create a truncated protein.   TTN gene encodes 364 exons that undergo extensive alternate splicing to produce several TTN isoforms ranging in length from 5604 to 34,350 amino acids. In the adult myocardium, isoform  N2BA and N2B are the two major full-length titin isoforms that are robustly expressed, in addition to low abundance short Novex isoforms. Heart muscles co-express 2970-kDa N2B titin (B) and 3300-kDa N2BA titin as major isoforms. In addition, novex-1/N2B and novex-2/N2B  isoforms are expressed as minor heart-specific isoforms.   Typically, protein truncating variants are considered pathogenic as these are loss of function mutations. However, TTN gene is unique, in that TTN truncating variants (TTNtv) are observed the general population; nearly 2% of individuals without overt cardiomyopathy harbor a TTN truncating variant. It is currently thought that TTNtv that reside towards the carboxyl-terminus of titin protein and localize in the sarcomere A-band, truncates the two principal isoforms of TTN (TTN N2B and TTN N2BA). Many pathogenic truncating variants in the A band have been linked to DCM with severely impaired LV function and life-threatening ventricular arrhythmias. Truncating variants of TTN (TTNtv) especially in the A-band region account for 20% of dilated cardiomyopathy (DCM) cases.   The TTN variant described in [place] is in exon 326 and falls within the distal exon that populates the A band of the sarcomere. This TTN variant is not reported in gnomAD, a large population database, indicating that it is a rare variant. However, there are no reports of this variant in patients with NICM. To date, only 2 clinical labs have reported this variant as a variant of unknown clinical significance. Clinical data is on these patients is unavailable. Clinical interpretation of TTN truncating variants is a challenge due to the enormous size of the protein and the high frequency of truncating variants in the general population.   Thus, the evidence for pathogenicity is circumstantial at this point, and there are no family members with NICM that can be informative to provide genotype phenotype correlation.  Hence, per ACMG guidelines, this TTN variant is considered a Variant of Unknown significance.   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 At this time, he  and his first-degree relatives do not need genetic testing of the familial TTN variant. Since we do not have conclusive evidence on the pathogenicity of this variant, it would be prudent to have his parents undergo clinical surveillance for cardiomyomathy. If they are found to have NICM then, genetic testing for the familial variant can be performed to determine if this variant segregates with disease. Natale verbalized understanding of this.   Sidney Ace, Ph.D, [facility] Clinical Molecular Geneticist

## 2021-10-31 ENCOUNTER — Other Ambulatory Visit: Payer: Self-pay

## 2021-12-05 ENCOUNTER — Inpatient Hospital Stay (HOSPITAL_COMMUNITY): Payer: Managed Care, Other (non HMO) | Attending: Hematology

## 2021-12-05 ENCOUNTER — Other Ambulatory Visit (HOSPITAL_COMMUNITY)
Admission: RE | Admit: 2021-12-05 | Discharge: 2021-12-05 | Disposition: A | Discharge status: Home / Self Care | Payer: Managed Care, Other (non HMO) | Source: Ambulatory Visit | Attending: Hematology | Admitting: Hematology

## 2021-12-05 DIAGNOSIS — E559 Vitamin D deficiency, unspecified: Secondary | ICD-10-CM | POA: Insufficient documentation

## 2021-12-05 DIAGNOSIS — D72810 Lymphocytopenia: Secondary | ICD-10-CM | POA: Insufficient documentation

## 2021-12-05 LAB — CBC WITH DIFFERENTIAL/PLATELET
Abs Immature Granulocytes: 0.01 10*3/uL (ref 0.00–0.07)
Basophils Absolute: 0.1 10*3/uL (ref 0.0–0.1)
Basophils Relative: 1 %
Eosinophils Absolute: 0.1 10*3/uL (ref 0.0–0.5)
Eosinophils Relative: 2 %
HCT: 46.2 % (ref 39.0–52.0)
Hemoglobin: 16.4 g/dL (ref 13.0–17.0)
Immature Granulocytes: 0 %
Lymphocytes Relative: 16 %
Lymphs Abs: 0.9 10*3/uL (ref 0.7–4.0)
MCH: 31.9 pg (ref 26.0–34.0)
MCHC: 35.5 g/dL (ref 30.0–36.0)
MCV: 89.9 fL (ref 80.0–100.0)
Monocytes Absolute: 0.6 10*3/uL (ref 0.1–1.0)
Monocytes Relative: 12 %
Neutro Abs: 3.6 10*3/uL (ref 1.7–7.7)
Neutrophils Relative %: 69 %
Platelets: 203 10*3/uL (ref 150–400)
RBC: 5.14 MIL/uL (ref 4.22–5.81)
RDW: 11.8 % (ref 11.5–15.5)
WBC: 5.3 10*3/uL (ref 4.0–10.5)
nRBC: 0 % (ref 0.0–0.2)

## 2021-12-05 LAB — VITAMIN D 25 HYDROXY (VIT D DEFICIENCY, FRACTURES): Vit D, 25-Hydroxy: 26.79 ng/mL — ABNORMAL LOW (ref 30–100)

## 2021-12-05 LAB — LACTATE DEHYDROGENASE: LDH: 114 U/L (ref 98–192)

## 2021-12-06 LAB — CD4/CD8 (T-HELPER/T-SUPPRESSOR CELL)
CD4 absolute: 513 /uL (ref 400–1790)
CD4%: 59.04 % (ref 33–65)
CD8 T Cell Abs: 96 /uL — ABNORMAL LOW (ref 190–1000)
CD8tox: 11 % — ABNORMAL LOW (ref 12–40)
Ratio: 5.36 — ABNORMAL HIGH (ref 1.0–3.0)
Total lymphocyte count: 869 /uL — ABNORMAL LOW (ref 1000–4000)

## 2021-12-12 ENCOUNTER — Inpatient Hospital Stay (HOSPITAL_BASED_OUTPATIENT_CLINIC_OR_DEPARTMENT_OTHER): Payer: Managed Care, Other (non HMO) | Admitting: Hematology

## 2021-12-12 VITALS — BP 126/86 | HR 76 | Temp 97.4°F | Resp 18 | Ht 71.0 in | Wt 169.3 lb

## 2021-12-12 DIAGNOSIS — D72819 Decreased white blood cell count, unspecified: Secondary | ICD-10-CM | POA: Diagnosis not present

## 2021-12-12 DIAGNOSIS — D72810 Lymphocytopenia: Secondary | ICD-10-CM | POA: Insufficient documentation

## 2021-12-12 DIAGNOSIS — E559 Vitamin D deficiency, unspecified: Secondary | ICD-10-CM | POA: Insufficient documentation

## 2021-12-12 DIAGNOSIS — R5383 Other fatigue: Secondary | ICD-10-CM | POA: Diagnosis not present

## 2021-12-12 NOTE — Progress Notes (Signed)
Nathaniel [facility] [address], [place] [address]   CLINIC:  Medical Oncology/Hematology  PCP:  Nathaniel Brunette, MD [address] / [place] [address]  [phone]  REASON FOR VISIT:  Follow-up for leukopenia   PRIOR THERAPY: none  CURRENT THERAPY: not done  INTERVAL HISTORY:  [name], a 31 y.o. male, returns for routine follow-up for his leukopenia . [name] was last seen on 07/22/2021.  Today he reports feeling well. He denies infections since his last visit. He reports fatigue.   REVIEW OF SYSTEMS:  Review of Systems  Constitutional:  Positive for fatigue. Negative for appetite change.  Neurological:  Positive for dizziness, headaches and numbness.  Psychiatric/Behavioral:  The patient is nervous/anxious.   All other systems reviewed and are negative.   PAST MEDICAL/SURGICAL HISTORY:  Past Medical History:  Diagnosis Date   Acne    Allergy April 2014   Rag weed   Elevated liver enzymes    GERD (gastroesophageal reflux disease)    Headache    Hematuria    Past Surgical History:  Procedure Laterality Date   COLONOSCOPY  MAY 2013   NEGATIVE FINDINGS--  BROTHER DECEASED AT AGE 20 FROM COLON CANCER   CYSTOSCOPY  11/02/2011   Procedure: CYSTOSCOPY FLEXIBLE;  Surgeon: Nathaniel Ludwig, MD;  Location: [facility];  Service: Urology;  Laterality: N/A;  IV SEDATION    SOCIAL HISTORY:  Social History   Socioeconomic History   Marital status: Married    Spouse name: Nathaniel Mann   Number of children: 0   Years of education: Not on file   Highest education level: Bachelor's degree (e.g., BA, AB, BS)  Occupational History    Comment: employed  Tobacco Use   Smoking status: Never   Smokeless tobacco: Never  Substance and Sexual Activity   Alcohol use: Yes    Comment: SOCIAL   Drug use: No   Sexual activity: Not on file  Other Topics Concern   Not on file  Social History Narrative   Lives with wife    Caffeine- none   Social Determinants of Health   Financial Resource Strain: Not on file  Food Insecurity: Not on file  Transportation Needs: Not on file  Physical Activity: Not on file  Stress: Not on file  Social Connections: Not on file  Intimate Partner Violence: Not on file    FAMILY HISTORY:  Family History  Problem Relation Age of Onset   Cancer Brother        Colon   Deep vein thrombosis Mother    Headache Father     CURRENT MEDICATIONS:  Current Outpatient Medications  Medication Sig Dispense Refill   ACETAMINOPHEN EXTRA STRENGTH 500 MG capsule Take 2 capsules by mouth every 8 (eight) hours.     Cholecalciferol (VITAMIN D) 125 MCG (5000 UT) CAPS Take 1 capsule by mouth.     Cholecalciferol 125 MCG (5000 UT) capsule Take by mouth.     escitalopram (LEXAPRO) 5 MG tablet 1 tablet     fluticasone (FLONASE) 50 MCG/ACT nasal spray Place into the nose.     meloxicam (MOBIC) 7.5 MG tablet Take 7.5 mg by mouth 2 (two) times daily.     Multiple Vitamin (MULTI VITAMIN) TABS Take 1 tablet by mouth daily.     NURTEC 75 MG TBDP      ondansetron (ZOFRAN) 4 MG tablet Take 4 mg by mouth every 6 (six) hours as needed.  pantoprazole (PROTONIX) 40 MG tablet Take 1 tablet by mouth daily.     venlafaxine XR (EFFEXOR-XR) 37.5 MG 24 hr capsule TAKE ONE CAPSULE BY MOUTH DAILY WITH FOOD     No current facility-administered medications for this visit.    ALLERGIES:  Allergies  Allergen Reactions   Omeprazole     Other reaction(s): rash   Amitriptyline Anxiety   Latex Rash   Sulfa Antibiotics Rash    PHYSICAL EXAM:  Performance status (ECOG): 0 - Asymptomatic  Vitals:   12/12/21 1539  BP: 126/86  Pulse: 76  Resp: 18  Temp: (!) 97.4 F (36.3 C)  SpO2: 100%   Wt Readings from Last 3 Encounters:  12/12/21 169 lb 5 oz (76.8 kg)  07/22/21 167 lb 8 oz (76 kg)  07/18/21 167 lb 3.2 oz (75.8 kg)   Physical Exam Vitals reviewed.  Constitutional:      Appearance: Normal  appearance.  Cardiovascular:     Rate and Rhythm: Normal rate and regular rhythm.     Pulses: Normal pulses.     Heart sounds: Normal heart sounds.  Pulmonary:     Effort: Pulmonary effort is normal.     Breath sounds: Normal breath sounds.  Abdominal:     Palpations: Abdomen is soft. There is no hepatomegaly, splenomegaly or mass.     Tenderness: There is no abdominal tenderness.  Neurological:     General: No focal deficit present.     Mental Status: He is alert and oriented to person, place, and time.  Psychiatric:        Mood and Affect: Mood normal.        Behavior: Behavior normal.     LABORATORY DATA:  I have reviewed the labs as listed.     Latest Ref Rng & Units 12/05/2021    3:25 PM 07/22/2021    8:43 AM 05/13/2016   10:13 PM  CBC  WBC 4.0 - 10.5 K/uL 5.3  4.0  7.0   Hemoglobin 13.0 - 17.0 g/dL 94.8  54.6  27.0   Hematocrit 39.0 - 52.0 % 46.2  49.8  47.3   Platelets 150 - 400 K/uL 203  207  155       Latest Ref Rng & Units 05/13/2016   10:13 PM  CMP  Glucose 65 - 99 mg/dL 350   BUN 6 - 20 mg/dL 14   Creatinine 0.93 - 1.24 mg/dL 8.18   Sodium 299 - 371 mmol/L 132   Potassium 3.5 - 5.1 mmol/L 3.1   Chloride 101 - 111 mmol/L 100   CO2 22 - 32 mmol/L 23   Calcium 8.9 - 10.3 mg/dL 8.7   Total Protein 6.5 - 8.1 g/dL 7.1   Total Bilirubin 0.3 - 1.2 mg/dL 2.5   Alkaline Phos 38 - 126 U/L 36   AST 15 - 41 U/L 20   ALT 17 - 63 U/L 19       Component Value Date/Time   RBC 5.14 12/05/2021 1525   MCV 89.9 12/05/2021 1525   MCH 31.9 12/05/2021 1525   MCHC 35.5 12/05/2021 1525   RDW 11.8 12/05/2021 1525   LYMPHSABS 0.9 12/05/2021 1525   MONOABS 0.6 12/05/2021 1525   EOSABS 0.1 12/05/2021 1525   BASOSABS 0.1 12/05/2021 1525    DIAGNOSTIC IMAGING:  I have independently reviewed the scans and discussed with the patient. No results found.   ASSESSMENT:  Leukopenia and lymphocytopenia: - Patient evaluated at the request of Dr. Renne Mann  for lymphocytopenia. -  CBC on 05/30/2021: White count 4.0, ALC 0.7 (0.9-3.6). - CBC on 05/18/2020: WBC-5, lymphocytes-15% - He does not have any B symptoms. - No new medications other than Lexapro which was started about 3 weeks ago.  He takes multivitamin and digestive enzyme over-the-counter. - He complains of feeling tired but is very stressed because of recent loss of pregnancy. - No recent history of systemic steroid use.    Social/family history: - He is married.  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  He is a non-smoker. - No family history of leukopenia's.  Half brother died of colon cancer at age 9.   PLAN:  Lymphocytopenia: - He does not report any infections in the last 4 months.  No B symptoms. - Complains of feeling tired.  [facility]  They apparently checked his iron levels.  I do not have those labs to review. - Physical examination did not reveal any palpable splenomegaly. - Reviewed labs from 12/05/2021 which showed normal LDH.  Absolute lymphocyte count has normalized at 0.9. - Lymphocyte subsets show CD8 T cells were slightly low. - I would recommend follow-up in 6 months with repeat labs.  No other work-up needed.  2.  Vitamin D deficiency: - He is taking vitamin D 1000 units daily.  Vitamin D level improved to 26.7 from 22 previously. - Recommend increasing vitamin D to 1400 units daily.  3.  Severe fatigue: - We will check TSH and testosterone.  He wants to have them drawn next week.  He will call us 2 to 3 days later for results.  Orders placed this encounter:  No orders of the defined types were placed in this encounter.    Doreatha Massed, MD [facility] [phone]   I, Alda Ponder, am acting as a scribe for Dr. Doreatha Massed.  I, Doreatha Massed MD, have reviewed the above documentation for accuracy and completeness, and I agree with the above.

## 2021-12-12 NOTE — Patient Instructions (Signed)
[facility] at [facility] Discharge Instructions  You were seen and examined today by Dr. Ellin Saba.  Dr. Ellin Saba discussed your most recent lab work and your labs look good. Increase Vitamin D to 1400 IU daily.   Come by next week for labs.  Follow-up as scheduled in 6 months.    Thank you for choosing  Cancer Center at [facility] to provide your oncology and hematology care.  To afford each patient quality time with our provider, please arrive at least 15 minutes before your scheduled appointment time.   If you have a lab appointment with the Cancer Center please come in thru the Main Entrance and check in at the main information desk.  You need to re-schedule your appointment should you arrive 10 or more minutes late.  We strive to give you quality time with our providers, and arriving late affects you and other patients whose appointments are after yours.  Also, if you no show three or more times for appointments you may be dismissed from the clinic at the providers discretion.     Again, thank you for choosing [facility].  Our hope is that these requests will decrease the amount of time that you wait before being seen by our physicians.       _____________________________________________________________  Should you have questions after your visit to [facility], please contact our office at [phone] and follow the prompts.  Our office hours are 8:00 a.m. and 4:30 p.m. Monday - Friday.  Please note that voicemails left after 4:00 p.m. may not be returned until the following business day.  We are closed weekends and major holidays.  You do have access to a nurse 24-7, just call the main number to the clinic [phone] and do not press any options, hold on the line and a nurse will answer the phone.    For prescription refill requests, have your pharmacy contact our office and allow 72 hours.

## 2021-12-19 ENCOUNTER — Inpatient Hospital Stay: Payer: Managed Care, Other (non HMO) | Attending: Hematology

## 2021-12-19 DIAGNOSIS — D72819 Decreased white blood cell count, unspecified: Secondary | ICD-10-CM | POA: Diagnosis present

## 2021-12-19 DIAGNOSIS — R5383 Other fatigue: Secondary | ICD-10-CM | POA: Insufficient documentation

## 2021-12-19 DIAGNOSIS — E559 Vitamin D deficiency, unspecified: Secondary | ICD-10-CM | POA: Insufficient documentation

## 2021-12-19 DIAGNOSIS — D72810 Lymphocytopenia: Secondary | ICD-10-CM

## 2021-12-19 LAB — TSH: TSH: 1.343 u[IU]/mL (ref 0.350–4.500)

## 2021-12-21 LAB — TESTOSTERONE: Testosterone: 551 ng/dL (ref 264–916)

## 2022-05-23 ENCOUNTER — Encounter: Payer: Self-pay | Admitting: Cardiology

## 2022-05-28 IMAGING — MR MR KNEE*R* W/O CM
4 of 6 series · 19 of 40 positions shown · non-contrast
Comparison: None.

CLINICAL DATA: Right knee pain

EXAM:
MRI OF THE RIGHT KNEE WITHOUT CONTRAST
TECHNIQUE: Multiplanar, multisequence MR imaging of the knee was performed. No
intravenous contrast was administered.

[Series 2: T2 fat-sat · axial · 4.0mm · 0.29mm/px · z∈[-74,+31]mm · 3 of 30 slices shown (1 of 2)]
[im 6/30]
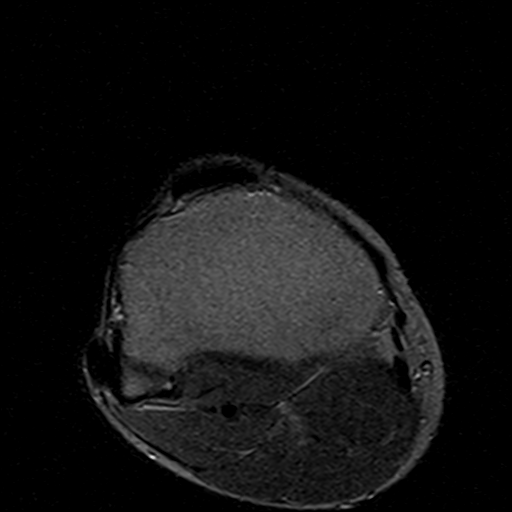
[im 18/30]
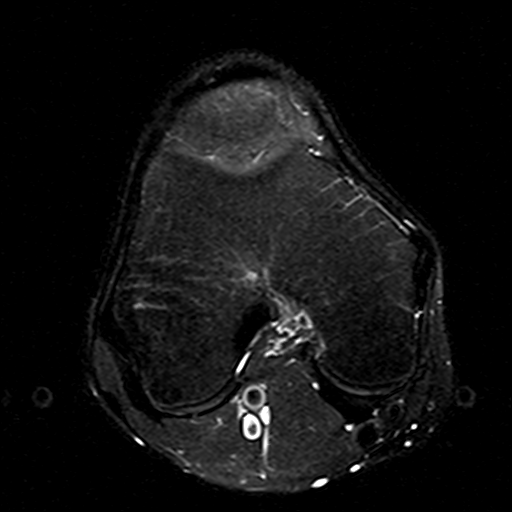
[im 30/30]
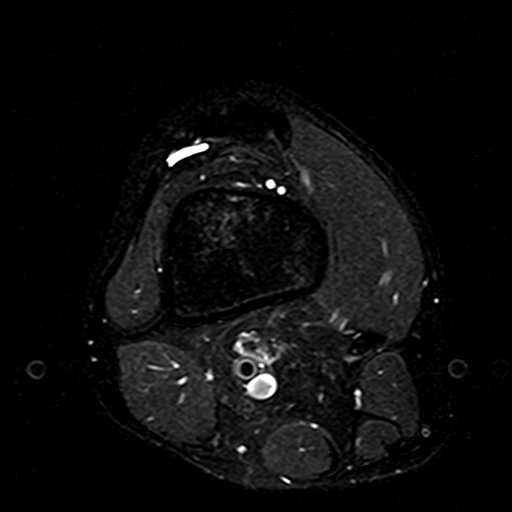

[Series 4: T2 fat-sat · coronal · 4.0mm · 0.29mm/px · 3 of 25 slices shown (2 of 2)]
[im 5/25]
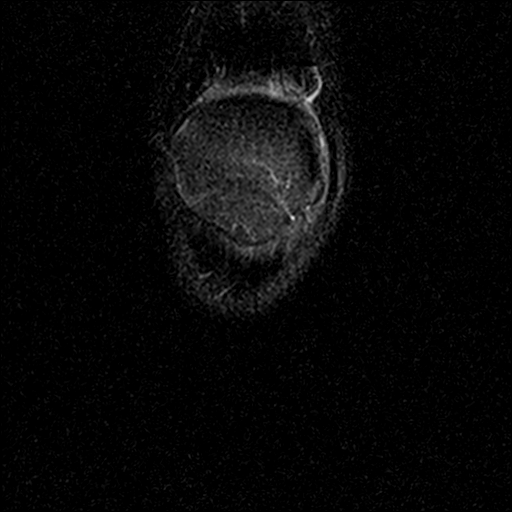
[im 15/25]
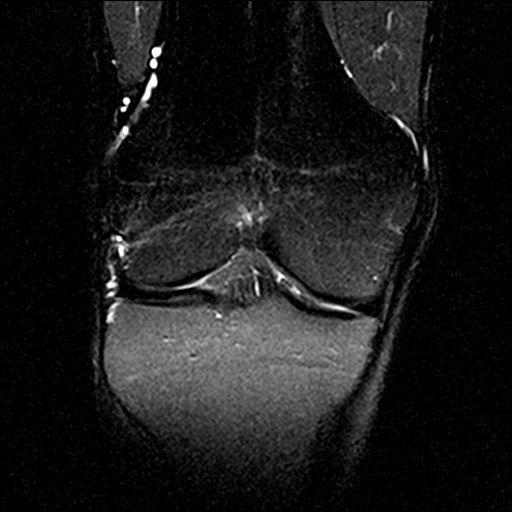
[im 25/25]
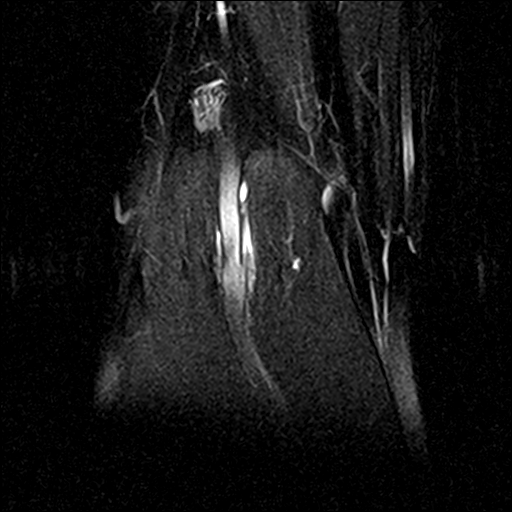

[Series 5: PD fat-sat · coronal · 3.0mm · 0.29mm/px · 8 of 32 slices shown (1 of 2)]
[im 1/32]
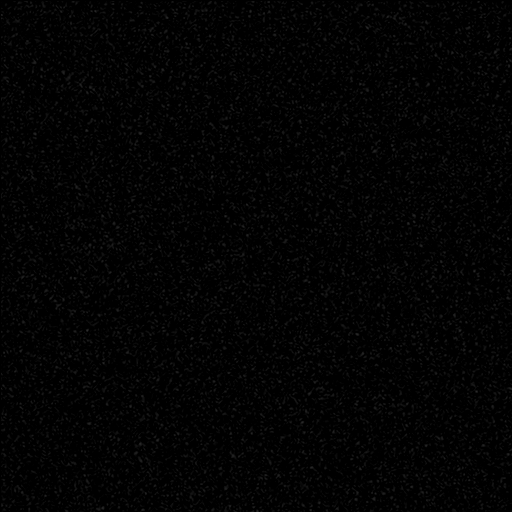
[im 5/32]
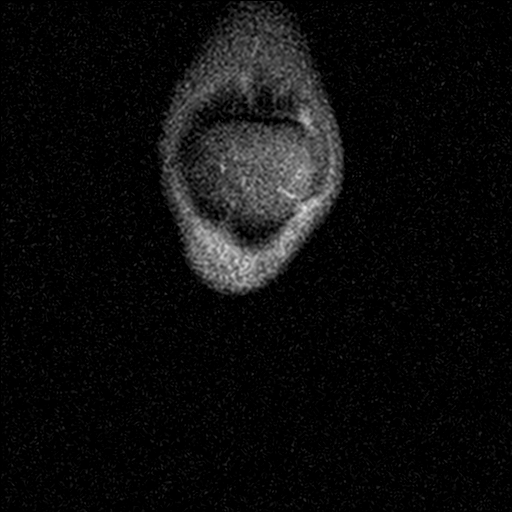
[im 9/32]
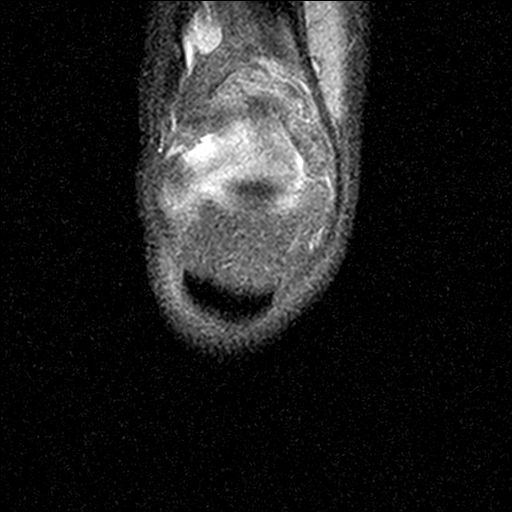
[im 14/32]
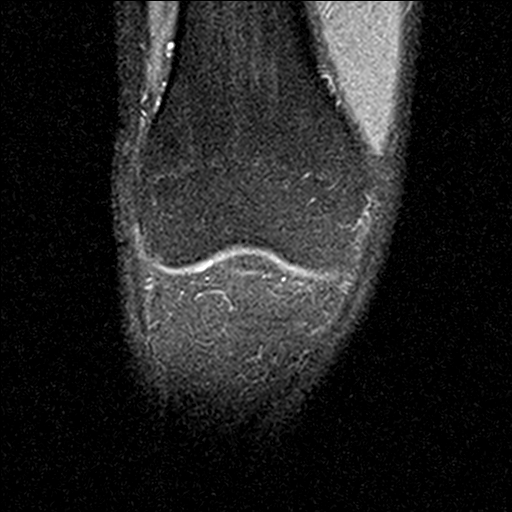
[im 18/32]
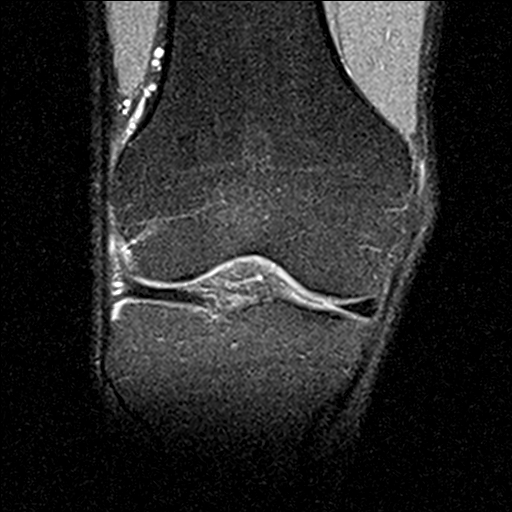
[im 23/32]
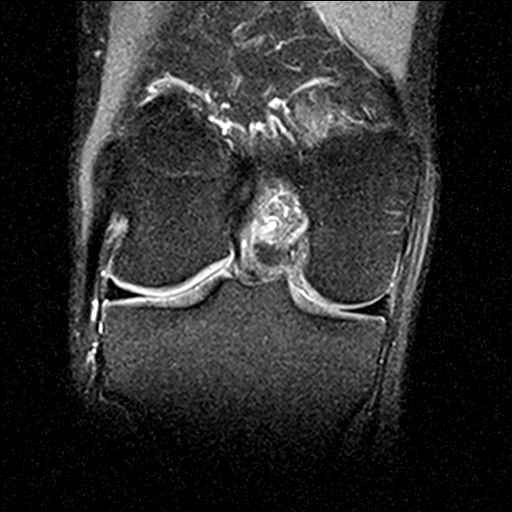
[im 27/32]
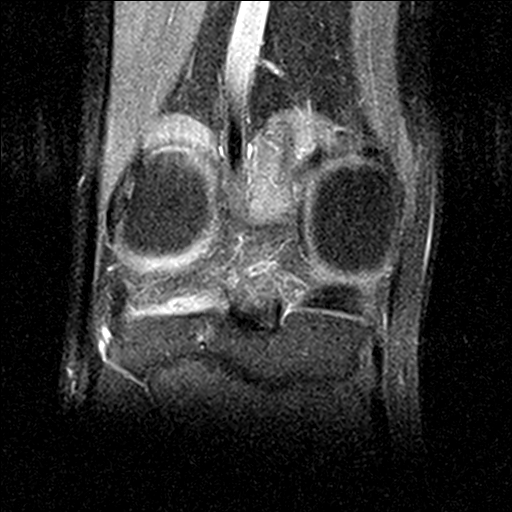
[im 32/32]
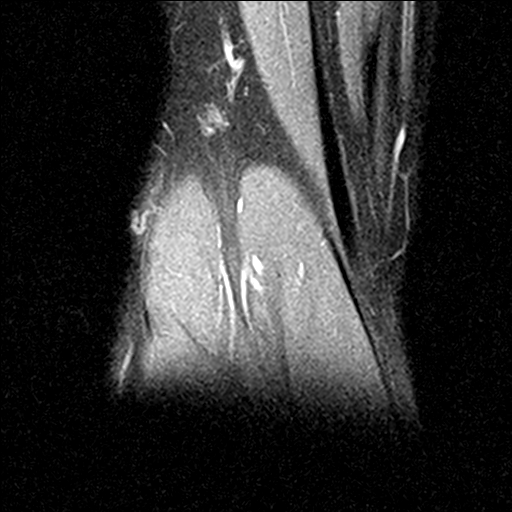

[Series 6: PD fat-sat · sagittal · 3.0mm · 0.29mm/px · 5 of 30 slices shown (2 of 2)]
[im 1/30]
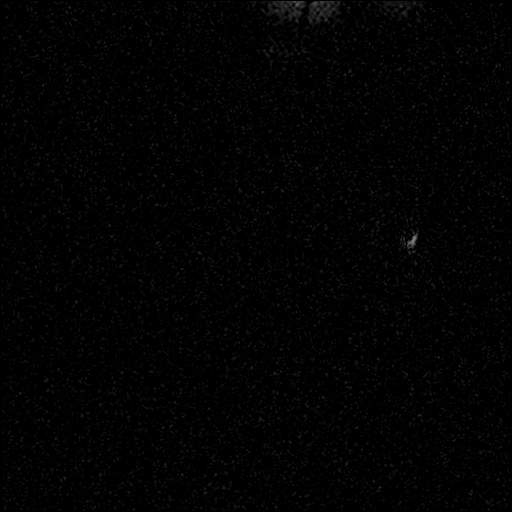
[im 5/30]
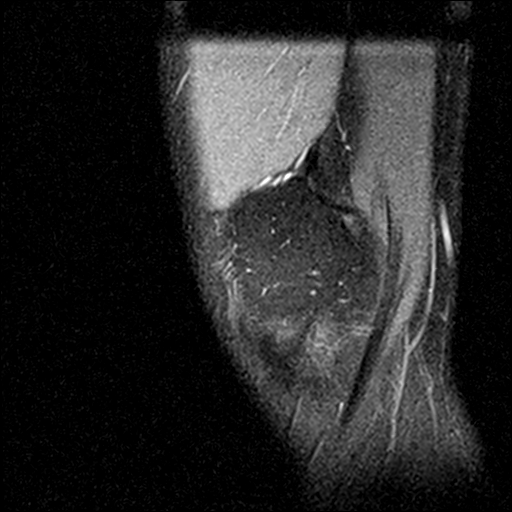
[im 10/30]
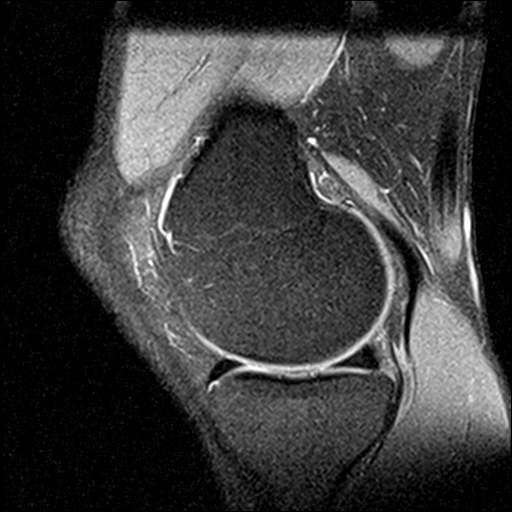
[im 15/30]
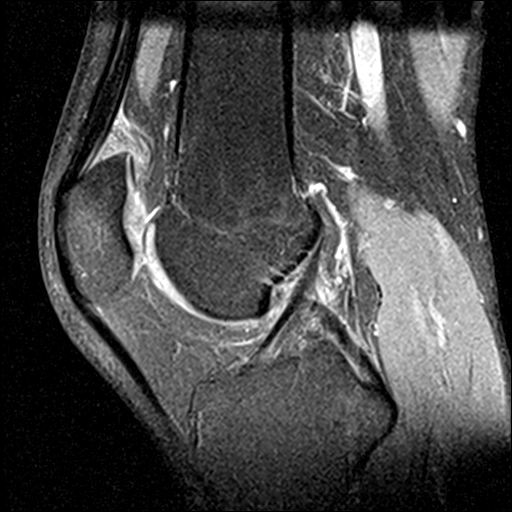
[im 25/30]
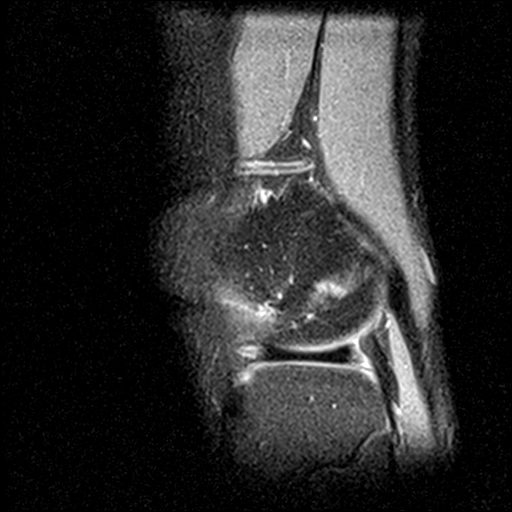

[19 of 40 positions shown; findings below may reference images not displayed]

FINDINGS: MENISCI

Medial: Intact.

Lateral: Intact.

LIGAMENTS

Cruciates: ACL and PCL are intact.

Collaterals: Medial collateral ligament is intact. Lateral
collateral ligament complex is intact.

CARTILAGE

Patellofemoral:  No chondral defect.

Medial:  No chondral defect.

Lateral:  No chondral defect.

JOINT: No joint effusion. There is mild edema signal within the
suprapatellar fat pad.

POPLITEAL FOSSA: No Baker's cyst.

EXTENSOR MECHANISM: Intact quadriceps tendon. Intact patellar
tendon.

BONES: No aggressive osseous lesion. No fracture or dislocation.

Other: No fluid collection or hematoma. Muscles are normal.
IMPRESSION: Mild edema signal within the suprapatellar fat pad, can be seen in
fat pad impingement.

No evidence of meniscus tear or ligamentous injury. No acute osseous
abnormality or focal chondral defect.

## 2022-06-01 ENCOUNTER — Encounter (HOSPITAL_COMMUNITY): Payer: Self-pay | Admitting: Cardiology

## 2022-06-14 ENCOUNTER — Telehealth (HOSPITAL_COMMUNITY): Payer: Self-pay | Admitting: Cardiology

## 2022-06-14 NOTE — Telephone Encounter (Signed)
Just an FYI. We have made several attempts to contact this patient including sending a letter to schedule or reschedule their echocardiogram. We will be removing the patient from the echo [place].   06/02/22 SENT  LETTER  THRU MY CHART [facility] 05/30/22 [facility] to schedule @ 9:18/[facility] 05/23/22 [facility] that MD does want him to have echo per DM,RN/[facility] 05/23/22 Inbasket sent to check when due , pt states not due for 2 more years/[facility] 9:56 05/23/22 [facility] to schedule x 2 @ 9:52/[facility] 05/16/22 [facility] to schedule @ 10:09/[facility]        Thank you

## 2022-06-19 ENCOUNTER — Other Ambulatory Visit: Payer: Managed Care, Other (non HMO)

## 2022-06-26 ENCOUNTER — Telehealth: Payer: Managed Care, Other (non HMO) | Admitting: Hematology

## 2022-07-10 ENCOUNTER — Other Ambulatory Visit (HOSPITAL_COMMUNITY)
Admission: RE | Admit: 2022-07-10 | Discharge: 2022-07-10 | Disposition: A | Discharge status: Home / Self Care | Payer: Managed Care, Other (non HMO) | Source: Ambulatory Visit | Attending: Hematology | Admitting: Hematology

## 2022-07-10 ENCOUNTER — Inpatient Hospital Stay: Payer: Managed Care, Other (non HMO) | Attending: Hematology

## 2022-07-10 DIAGNOSIS — D72810 Lymphocytopenia: Secondary | ICD-10-CM | POA: Insufficient documentation

## 2022-07-10 LAB — CBC WITH DIFFERENTIAL/PLATELET
Abs Immature Granulocytes: 0.01 10*3/uL (ref 0.00–0.07)
Basophils Absolute: 0.1 10*3/uL (ref 0.0–0.1)
Basophils Relative: 1 %
Eosinophils Absolute: 0.1 10*3/uL (ref 0.0–0.5)
Eosinophils Relative: 2 %
HCT: 47.5 % (ref 39.0–52.0)
Hemoglobin: 16.7 g/dL (ref 13.0–17.0)
Immature Granulocytes: 0 %
Lymphocytes Relative: 10 %
Lymphs Abs: 0.6 10*3/uL — ABNORMAL LOW (ref 0.7–4.0)
MCH: 32 pg (ref 26.0–34.0)
MCHC: 35.2 g/dL (ref 30.0–36.0)
MCV: 91 fL (ref 80.0–100.0)
Monocytes Absolute: 0.6 10*3/uL (ref 0.1–1.0)
Monocytes Relative: 10 %
Neutro Abs: 4.6 10*3/uL (ref 1.7–7.7)
Neutrophils Relative %: 77 %
Platelets: 198 10*3/uL (ref 150–400)
RBC: 5.22 MIL/uL (ref 4.22–5.81)
RDW: 11.9 % (ref 11.5–15.5)
WBC: 5.9 10*3/uL (ref 4.0–10.5)
nRBC: 0 % (ref 0.0–0.2)

## 2022-07-10 LAB — LACTATE DEHYDROGENASE: LDH: 116 U/L (ref 98–192)

## 2022-07-10 LAB — VITAMIN D 25 HYDROXY (VIT D DEFICIENCY, FRACTURES): Vit D, 25-Hydroxy: 32.27 ng/mL (ref 30–100)

## 2022-07-16 NOTE — Progress Notes (Signed)
Virtual Visit via Telephone Note  I connected with Nathaniel Mann on 07/16/22 at  4:00 PM EST by telephone and verified that I am speaking with the correct person using two identifiers.  Location: Patient: Home Provider: Clinic   I discussed the limitations, risks, security and privacy concerns of performing an evaluation and management service by telephone and the availability of in person appointments. I also discussed with the patient that there may be a patient responsible charge related to this service. The patient expressed understanding and agreed to proceed.   History of Present Illness: Nathaniel Mann, a 32 y.o. male, returns for routine follow-up for his leukopenia. He was last seen by me on 12/12/21.  Today, he states that he is doing well overall. His appetite level is at ***%. His energy level is at ***%.    Observations/Objective:   Assessment and Plan:  ASSESSMENT:  Leukopenia and lymphocytopenia: - Patient evaluated at the request of Dr. Shelia Media for lymphocytopenia. - CBC on 05/30/2021: White count 4.0, ALC 0.7 (0.9-3.6). - CBC on 05/18/2020: WBC-5, lymphocytes-15% - He does not have any B symptoms. - No new medications other than Lexapro which was started about 3 weeks ago.  He takes multivitamin and digestive enzyme over-the-counter. - He complains of feeling tired but is very stressed because of recent loss of pregnancy. - No recent history of systemic steroid use.    Social/family history: - He is married.  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  He is a non-smoker. - No family history of leukopenia's.  Half brother died of colon cancer at age 22.     PLAN:  Lymphocytopenia: - He does not report any infections in the last 4 months.  No B symptoms. - Complains of feeling tired.  [facility]  They apparently checked his iron levels.  I do not have those labs to review. - Physical examination  did not reveal any palpable splenomegaly. - Reviewed labs from 12/05/2021 which showed normal LDH.  Absolute lymphocyte count has normalized at 0.9. - Lymphocyte subsets show CD8 T cells were slightly low. - I would recommend follow-up in 6 months with repeat labs.  No other work-up needed.  2.  Vitamin D deficiency: - He is taking vitamin D 1000 units daily.  Vitamin D level improved to 26.7 from 22 previously. - Recommend increasing vitamin D to 1400 units daily.  3.  Severe fatigue: - We will check TSH and testosterone.  He wants to have them drawn next week.  He will call us 2 to 3 days later for results.   Follow Up Instructions:    I discussed the assessment and treatment plan with the patient. The patient was provided an opportunity to ask questions and all were answered. The patient agreed with the plan and demonstrated an understanding of the instructions.   The patient was advised to call back or seek an in-person evaluation if the symptoms worsen or if the condition fails to improve as anticipated.  I provided *** minutes of non-face-to-face time during this encounter.    I,Alexis Herring,acting as a Education administrator for Derek Jack, MD.,have documented all relevant documentation on the behalf of Derek Jack, MD,as directed by  Derek Jack, MD while in the presence of Derek Jack, MD.  ***

## 2022-07-17 ENCOUNTER — Inpatient Hospital Stay: Payer: Managed Care, Other (non HMO) | Attending: Hematology | Admitting: Hematology

## 2022-07-17 ENCOUNTER — Encounter: Payer: Self-pay | Admitting: Hematology

## 2022-07-17 VITALS — Wt 183.0 lb

## 2022-07-17 DIAGNOSIS — D72810 Lymphocytopenia: Secondary | ICD-10-CM | POA: Diagnosis not present

## 2024-04-29 ENCOUNTER — Ambulatory Visit: Admitting: Internal Medicine

## 2024-04-29 ENCOUNTER — Other Ambulatory Visit: Payer: Self-pay

## 2024-04-29 ENCOUNTER — Encounter: Payer: Self-pay | Admitting: Internal Medicine

## 2024-04-29 VITALS — BP 120/70 | HR 76 | Temp 98.5°F | Ht 71.0 in | Wt 215.5 lb

## 2024-04-29 DIAGNOSIS — L508 Other urticaria: Secondary | ICD-10-CM

## 2024-04-29 DIAGNOSIS — J3089 Other allergic rhinitis: Secondary | ICD-10-CM | POA: Diagnosis not present

## 2024-04-29 DIAGNOSIS — L2084 Intrinsic (allergic) eczema: Secondary | ICD-10-CM | POA: Diagnosis not present

## 2024-04-29 MED ORDER — FAMOTIDINE 20 MG PO TABS: 20.0000 mg | ORAL_TABLET | Freq: Two times a day (BID) | ORAL | 5 refills | Status: AC | PRN

## 2024-04-29 MED ORDER — TACROLIMUS 0.1 % EX OINT: TOPICAL_OINTMENT | Freq: Two times a day (BID) | CUTANEOUS | 5 refills | Status: AC

## 2024-04-29 MED ORDER — TRIAMCINOLONE ACETONIDE 0.1 % EX OINT: TOPICAL_OINTMENT | CUTANEOUS | 1 refills | Status: AC

## 2024-04-29 MED ORDER — CETIRIZINE HCL 10 MG PO TABS: 10.0000 mg | ORAL_TABLET | Freq: Two times a day (BID) | ORAL | 5 refills | Status: AC | PRN

## 2024-04-29 MED ORDER — HYDROCORTISONE 2.5 % EX CREA: TOPICAL_CREAM | CUTANEOUS | 5 refills | Status: AC

## 2024-04-29 NOTE — Progress Notes (Signed)
 NEW PATIENT  Date of Service/Encounter:  04/29/2024  Consult requested by: Clarice Nottingham, MD   Subjective:   Nathaniel Mann (DOB: March 20, 1991) is a 33 y.o. male who presents to the clinic on 04/29/2024 with a chief complaint of Rash, Eczema, and Allergic Rhinitis  (Itching skin and throat) .    History obtained from: chart review and patient.   Rhinitis:  Started about a year or two ago.  Symptoms include: nasal congestion, rhinorrhea, and sneezing; new onset of hands/feet itching  Occurs seasonally-spring  Potential triggers: not sure  Treatments tried:  Zyrtec   Headaches with Flonase  Previous allergy testing: yes; negative in the past  History of sinus surgery: no Nonallergic triggers: none   Rashes:  Started around high school.  Notes having itch attacks where he would get itchy all over and then had red patches.  Worse with sweating/exercise.  Then it improved but flared up in 2018 and saw Allergist.  Did an allergy panel with negative foods.  Patch testing was positive for nickel, something that was in drywall and something in shampoo (possibly MI/MCI).  He was doing well since avoiding these allergens.  But now in February 2025, he has started having hand eczema and some patches around chest.  Works from [employer].  Hobbies mostly include reading, going to the gym.   Has seen Dermatology.  Improves when he goes on vacation.  Reports use of fragrance/dye free products. Identified triggers of flares include  Sleep is not affected Currently using aveeno lotion, charlie soap for laundry detergent, unscented/fragrance free soap.   Also has topical steroids to use PRN  In terms of hives, he has pictures of this.  Usually happens more with hot weather, sweating, stress.  Last episode of hives was a long time ago but he still has trouble with significant itching all over and that improves with Zyrtec .    Reviewed:  07/17/2022: seen by heme for lymphocytopenia/leukopenia,  plan to recheck in 1 year.   04/05/2022: seen by [facility]; normal hearing.    Past Medical History: Past Medical History:  Diagnosis Date   Acne    Allergy April 2014   Rag weed   Elevated liver enzymes    GERD (gastroesophageal reflux disease)    Headache    Hematuria     Past Surgical History: Past Surgical History:  Procedure Laterality Date   COLONOSCOPY  MAY 2013   NEGATIVE FINDINGS--  BROTHER DECEASED AT AGE 43 FROM COLON CANCER   CYSTOSCOPY  11/02/2011   Procedure: CYSTOSCOPY FLEXIBLE;  Surgeon: Arlena LILLETTE Gal, MD;  Location: [facility];  Service: Urology;  Laterality: N/A;  IV SEDATION    Family History: Family History  Problem Relation Age of Onset   Allergic rhinitis Mother    Deep vein thrombosis Mother    Allergic rhinitis Father    Headache Father    Cancer Brother        Colon    Social History:  Flooring in bedroom: wood Pets: cat Tobacco use/exposure: none Job: furniture conservator/restorer   Medication List:  Allergies as of 04/29/2024       Reactions   Omeprazole    Other reaction(s): rash   Amitriptyline  Anxiety   Latex Rash   Sulfa Antibiotics Rash        Medication List        Accurate as of April 29, 2024  9:42 AM. If you have any questions, ask your nurse or doctor.  Acetaminophen Extra Strength 500 MG Caps Take 2 capsules by mouth every 8 (eight) hours. What changed:  when to take this reasons to take this   cetirizine  10 MG tablet Commonly known as: ZyrTEC  Allergy Take 1 tablet (10 mg total) by mouth 2 (two) times daily as needed (hives). Started by: Arleta Blanch, MD   escitalopram 5 MG tablet Commonly known as: LEXAPRO 1 tablet   famotidine  20 MG tablet Commonly known as: Pepcid  Take 1 tablet (20 mg total) by mouth 2 (two) times daily as needed (hives). Started by: Arleta Blanch, MD   fluticasone 50 MCG/ACT nasal spray Commonly known as: FLONASE Place into the nose.   hydrocortisone   2.5 % cream Apply twice daily for flare ups above neck, maximum 7 days. Started by: Arleta Blanch, MD   meloxicam 7.5 MG tablet Commonly known as: MOBIC Take 7.5 mg by mouth 2 (two) times daily.   Multi Vitamin Tabs Take 1 tablet by mouth daily.   Nurtec 75 MG Tbdp Generic drug: Rimegepant Sulfate   ondansetron  4 MG tablet Commonly known as: ZOFRAN  Take 4 mg by mouth every 6 (six) hours as needed.   pantoprazole 40 MG tablet Commonly known as: PROTONIX Take 1 tablet by mouth daily.   tacrolimus  0.1 % ointment Commonly known as: Protopic  Apply topically 2 (two) times daily. Started by: Arleta Blanch, MD   triamcinolone  ointment 0.1 % Commonly known as: KENALOG  Apply twice daily for flare ups below neck, maximum 10 days. Started by: Arleta Blanch, MD   Vitamin D  125 MCG (5000 UT) Caps Take 1 capsule by mouth.   Cholecalciferol 125 MCG (5000 UT) capsule Take by mouth.         REVIEW OF SYSTEMS: Pertinent positives and negatives discussed in HPI.   Objective:   Physical Exam: BP 120/70 (BP Location: Left Arm, Patient Position: Sitting, Cuff Size: Normal)   Pulse 76   Temp 98.5 F (36.9 C) (Temporal)   Ht 5' 11 (1.803 m)   Wt 215 lb 8 oz (97.8 kg)   SpO2 97%   BMI 30.06 kg/m  Body mass index is 30.06 kg/m. GEN: alert, well developed HEENT: clear conjunctiva, nose with + mild inferior turbinate hypertrophy, pink nasal mucosa, slight clear rhinorrhea, no cobblestoning HEART: regular rate and rhythm, no murmur LUNGS: clear to auscultation bilaterally, no coughing, unlabored respiration ABDOMEN: soft, non distended  SKIN: bl dry hands with cracking   Assessment:   1. Chronic urticaria   2. Intrinsic atopic dermatitis   3. Other allergic rhinitis     Plan/Recommendations:   Other Allergic Rhinitis: - Due to turbinate hypertrophy, seasonal symptoms and unresponsive to over the counter meds, will perform skin testing to identify aeroallergen triggers.   -  Use nasal saline rinses before nose sprays such as with Neilmed Sinus Rinse.  Use distilled water .  - Use Zyrtec  10 mg daily.   Eczema: - For hand dermatitis, discussed wet/dry cotton gloves.  - Do a daily soaking tub bath in warm water  for 10-15 minutes.  - Use a gentle, unscented cleanser at the end of the bath (such as Dove unscented bar or baby wash, or Aveeno sensitive body wash). Then rinse, pat half-way dry, and apply a gentle, unscented moisturizer cream or ointment (Cerave, Cetaphil, Eucerin, Aveeno, Aquaphor, Vanicream, Vaseline)  all over while still damp. Dry skin makes the itching and rash of eczema worse. The skin should be moisturized with a gentle, unscented moisturizer at least twice daily.  -  Use only unscented liquid laundry detergent. - Apply prescribed topical steroid (triamcinolone  0.1% below neck or hydrocortisone  2.5% above neck) to flared areas (red and thickened eczema) after the moisturizer has soaked into the skin (wait at least 30 minutes). Taper off the topical steroids as the skin improves. Do not use topical steroid for more than 7-10 days at a time.  - Put Protopic  0.1% onto areas of rough eczema twice a day. May decrease to once a day as the eczema improves. This will not thin the skin, and is safe for chronic use. Do not put this onto normal appearing skin.  Urticaria (Hives): - At this time etiology of hives and swelling is unknown. Hives can be caused by a variety of different triggers including illness/infection, pressure, vibrations, extremes of temperature to name a few however majority of the time there is no identifiable trigger.  -With hives, increase to Zyrtec  10mg  twice daily.  -If no improvement in 2 days, add Pepcid  20mg  twice daily and continue Zyrtec  10mg  twice daily.  Concern for contact dermatitis Discussed with patient that patch testing tests for contact dermatitis. Positive patch testing results can help in avoiding those items.  There is a  chance of false negative results or the chemical causing your reaction is not part of the patch panel. Our panel is the NAC80 The sensitivity of patch testing can range from 60-80% and therefore false negative results are possible.  Therefore, this does not definitively rule out contact dermatitis.  Please schedule for patch testing in our [facility] office - will need to come Monday, Wednesday, Friday. Please avoid strenuous physical activities and do not get the patches on the back wet.  Okay to take antihistamines for itching but avoid placing any creams on the back where the patches are. No steroids for 2 weeks before.  We will remove the patches on Wednesday and will do our initial read. Then you will come back on Friday for a final read.    Follow up: 12/23 at 830 for skin testing 1-55, no IDs Hold all anti-histamines (Xyzal, Allegra, Zyrtec , Claritin, Benadryl, Pepcid ) 3 days prior to next visit.   Follow up: Patch testing NAC 80   Arleta Blanch, MD [facility] 

## 2024-04-29 NOTE — Patient Instructions (Addendum)
 Other Allergic Rhinitis: - Use nasal saline rinses before nose sprays such as with Neilmed Sinus Rinse.  Use distilled water .  - Use Zyrtec  10 mg daily.   Eczema: - Do a daily soaking tub bath in warm water  for 10-15 minutes.  - Use a gentle, unscented cleanser at the end of the bath (such as Dove unscented bar or baby wash, or Aveeno sensitive body wash). Then rinse, pat half-way dry, and apply a gentle, unscented moisturizer cream or ointment (Cerave, Cetaphil, Eucerin, Aveeno, Aquaphor, Vanicream, Vaseline)  all over while still damp. Dry skin makes the itching and rash of eczema worse. The skin should be moisturized with a gentle, unscented moisturizer at least twice daily.  - Use only unscented liquid laundry detergent. - Apply prescribed topical steroid (triamcinolone  0.1% below neck or hydrocortisone  2.5% above neck) to flared areas (red and thickened eczema) after the moisturizer has soaked into the skin (wait at least 30 minutes). Taper off the topical steroids as the skin improves. Do not use topical steroid for more than 7-10 days at a time.  - Put Protopic  0.1% onto areas of rough eczema twice a day. May decrease to once a day as the eczema improves. This will not thin the skin, and is safe for chronic use. Do not put this onto normal appearing skin.  Urticaria (Hives): - At this time etiology of hives and swelling is unknown. Hives can be caused by a variety of different triggers including illness/infection, pressure, vibrations, extremes of temperature to name a few however majority of the time there is no identifiable trigger.  -With hives, increase to Zyrtec  10mg  twice daily.  -If no improvement in 2 days, add Pepcid  20mg  twice daily and continue Zyrtec  10mg  twice daily.  Concern for contact dermatitis Discussed with patient that patch testing tests for contact dermatitis. Positive patch testing results can help in avoiding those items.  There is a chance of false negative results or  the chemical causing your reaction is not part of the patch panel. Our panel is the NAC80 The sensitivity of patch testing can range from 60-80% and therefore false negative results are possible.  Therefore, this does not definitively rule out contact dermatitis.  Please schedule for patch testing in our [facility] office - will need to come Monday, Wednesday, Friday. Please avoid strenuous physical activities and do not get the patches on the back wet.  Okay to take antihistamines for itching but avoid placing any creams on the back where the patches are. No steroids for 2 weeks before.  We will remove the patches on Wednesday and will do our initial read. Then you will come back on Friday for a final read.      Follow up: 12/23 at 830 for skin testing 1-55 Hold all anti-histamines (Xyzal, Allegra, Zyrtec , Claritin, Benadryl, Pepcid ) 3 days prior to next visit.    Follow up: Patch testing NAC 80

## 2024-05-06 ENCOUNTER — Ambulatory Visit: Admitting: Internal Medicine

## 2024-05-06 DIAGNOSIS — J3089 Other allergic rhinitis: Secondary | ICD-10-CM

## 2024-05-06 DIAGNOSIS — L2084 Intrinsic (allergic) eczema: Secondary | ICD-10-CM

## 2024-05-06 MED ORDER — AZELASTINE HCL 0.1 % NA SOLN: 2.0000 | Freq: Two times a day (BID) | NASAL | 5 refills | Status: AC | PRN

## 2024-05-06 NOTE — Progress Notes (Signed)
 "  FOLLOW UP Date of Service/Encounter:  05/06/2024   Subjective:  Nathaniel Mann (DOB: 11/12/1990) is a 33 y.o. male who [facility] for follow up for skin testing.   History obtained from: chart review and patient.  Anti histamines held.   Past Medical History: Past Medical History:  Diagnosis Date   Acne    Allergy  April 2014   Rag weed   Elevated liver enzymes    GERD (gastroesophageal reflux disease)    Headache    Hematuria     Objective:  There were no vitals taken for this visit. There is no height or weight on file to calculate BMI. Physical Exam: GEN: alert, well developed HEENT: clear conjunctiva, MMM LUNGS: unlabored respiration  Skin Testing:  Skin prick testing was placed, which includes aeroallergens/foods, histamine control, and saline control.  Verbal consent was obtained prior to placing test.  Patient tolerated procedure well.  Allergy  testing results were read and interpreted by myself, documented by clinical staff. Adequate positive and negative control.  Positive results to:  Results discussed with patient/family.  Airborne Adult Perc - 05/06/24 0848     Time Antigen Placed 0849    Allergen Manufacturer Jestine    Location Back    Number of Test 55    1. Control-Buffer 50% Glycerol Negative    2. Control-Histamine 3+    3. Bahia Negative    4. Bermuda Negative    5. Johnson Negative    6. [place]  Blue Negative    7. Meadow Fescue Negative    8. Perennial Rye Negative    9. Timothy Negative    10. Ragweed Mix Negative    11. Cocklebur Negative    12. Plantain,  English Negative    13. Baccharis Negative    14. Dog Fennel Negative    15. Russian Thistle Negative    16. Lamb's Quarters Negative    17. Sheep Sorrell Negative    18. Rough Pigweed Negative    19. Marsh Elder, Rough Negative    20. Mugwort, Common Negative    21. Box, Elder Negative    22. Cedar, red Negative    23. Sweet Gum  Negative    24. Pecan Pollen Negative    25. Pine Mix Negative    26. Walnut, Black Pollen Negative    27. Red Mulberry Negative    28. Ash Mix Negative    29. Birch Mix Negative    30. Beech American Negative    31. Cottonwood, Eastern Negative    32. Hickory, White Negative    33. Maple Mix Negative    34. Oak, Eastern Mix Negative    35. Sycamore Eastern Negative    36. Alternaria Alternata Negative    37. Cladosporium Herbarum Negative    38. Aspergillus Mix Negative    39. Penicillium Mix Negative    40. Bipolaris Sorokiniana (Helminthosporium) Negative    41. Drechslera Spicifera (Curvularia) Negative    42. Mucor Plumbeus Negative    43. Fusarium Moniliforme Negative    44. Aureobasidium Pullulans (pullulara) Negative    45. Rhizopus Oryzae Negative    46. Botrytis Cinera Negative    47. Epicoccum Nigrum Negative    48. Phoma Betae Negative    49. Dust Mite Mix Negative    50. Cat Hair 10,000 BAU/ml Negative    51.  Dog Epithelia Negative    52. Mixed Feathers Negative    53. Horse  Epithelia Negative    54. Cockroach, German Negative    55. Tobacco Leaf Negative         Previous positive testing: Chromium, Nickel, MCI   Assessment:   1. Intrinsic atopic dermatitis   2. Other allergic rhinitis     Plan/Recommendations:  Other Allergic Rhinitis: - Due to turbinate hypertrophy, seasonal symptoms, eczema, recurrent hives and unresponsive to over the counter meds, will perform skin testing to identify aeroallergen triggers.   - Positive skin test 04/2024: none - Use nasal saline rinses before nose sprays such as with Neilmed Sinus Rinse.  Use distilled water .   - Use Azelastine  2 sprays each nostril twice daily as needed for runny nose, drainage, sneezing, congestion. Aim upward and outward. - Use Zyrtec  10 mg daily.   Eczema: - Do a daily soaking tub bath in warm water  for 10-15 minutes.  - Use a gentle, unscented cleanser at the end of the bath (such as Dove  unscented bar or baby wash, or Aveeno sensitive body wash). Then rinse, pat half-way dry, and apply a gentle, unscented moisturizer cream or ointment (Cerave, Cetaphil, Eucerin, Aveeno, Aquaphor, Vanicream, Vaseline)  all over while still damp. Dry skin makes the itching and rash of eczema worse. The skin should be moisturized with a gentle, unscented moisturizer at least twice daily.  - Use only unscented liquid laundry detergent. - Apply prescribed topical steroid (triamcinolone  0.1% below neck or hydrocortisone  2.5% above neck) to flared areas (red and thickened eczema) after the moisturizer has soaked into the skin (wait at least 30 minutes). Taper off the topical steroids as the skin improves. Do not use topical steroid for more than 7-10 days at a time.  - Put Protopic  0.1% onto areas of rough eczema twice a day. May decrease to once a day as the eczema improves. This will not thin the skin, and is safe for chronic use. Do not put this onto normal appearing skin.  Urticaria (Hives): - At this time etiology of hives and swelling is unknown. Hives can be caused by a variety of different triggers including illness/infection, pressure, vibrations, extremes of temperature to name a few however majority of the time there is no identifiable trigger.  -With hives, increase to Zyrtec  10mg  twice daily.  -If no improvement in 2 days, add Pepcid  20mg  twice daily and continue Zyrtec  10mg  twice daily.  Concern for contact dermatitis Discussed with patient that patch testing tests for contact dermatitis. Positive patch testing results can help in avoiding those items.  There is a chance of false negative results or the chemical causing your reaction is not part of the patch panel. Our panel is the NAC80 The sensitivity of patch testing can range from 60-80% and therefore false negative results are possible.  Therefore, this does not definitively rule out contact dermatitis.  Please schedule for patch testing in  our [facility] office - will need to come Monday, Wednesday, Friday. Please avoid strenuous physical activities and do not get the patches on the back wet.  Okay to take antihistamines for itching but avoid placing any creams on the back where the patches are. No steroids for 2 weeks before.  We will remove the patches on Wednesday and will do our initial read. Then you will come back on Friday for a final read.   Keep follow up in February   Arleta Blanch, MD [facility]       "

## 2024-05-06 NOTE — Patient Instructions (Addendum)
 Chronic Rhinitis: - Positive skin test 04/2024: none - Use nasal saline rinses before nose sprays such as with Neilmed Sinus Rinse.  Use distilled water .   - Use Azelastine  2 sprays each nostril twice daily as needed for runny nose, drainage, sneezing, congestion. Aim upward and outward. - Use Zyrtec  10 mg daily.   Eczema: - Do a daily soaking tub bath in warm water  for 10-15 minutes.  - Use a gentle, unscented cleanser at the end of the bath (such as Dove unscented bar or baby wash, or Aveeno sensitive body wash). Then rinse, pat half-way dry, and apply a gentle, unscented moisturizer cream or ointment (Cerave, Cetaphil, Eucerin, Aveeno, Aquaphor, Vanicream, Vaseline)  all over while still damp. Dry skin makes the itching and rash of eczema worse. The skin should be moisturized with a gentle, unscented moisturizer at least twice daily.  - Use only unscented liquid laundry detergent. - Apply prescribed topical steroid (triamcinolone  0.1% below neck or hydrocortisone  2.5% above neck) to flared areas (red and thickened eczema) after the moisturizer has soaked into the skin (wait at least 30 minutes). Taper off the topical steroids as the skin improves. Do not use topical steroid for more than 7-10 days at a time.  - Put Protopic  0.1% onto areas of rough eczema twice a day. May decrease to once a day as the eczema improves. This will not thin the skin, and is safe for chronic use. Do not put this onto normal appearing skin.  Urticaria (Hives): - At this time etiology of hives and swelling is unknown. Hives can be caused by a variety of different triggers including illness/infection, pressure, vibrations, extremes of temperature to name a few however majority of the time there is no identifiable trigger.  -With hives, increase to Zyrtec  10mg  twice daily.  -If no improvement in 2 days, add Pepcid  20mg  twice daily and continue Zyrtec  10mg  twice daily.  Concern for contact dermatitis Discussed with  patient that patch testing tests for contact dermatitis. Positive patch testing results can help in avoiding those items.  There is a chance of false negative results or the chemical causing your reaction is not part of the patch panel. Our panel is the NAC80 The sensitivity of patch testing can range from 60-80% and therefore false negative results are possible.  Therefore, this does not definitively rule out contact dermatitis.  Please schedule for patch testing in our [facility] office - will need to come Monday, Wednesday, Friday. Please avoid strenuous physical activities and do not get the patches on the back wet.  Okay to take antihistamines for itching but avoid placing any creams on the back where the patches are. No steroids for 2 weeks before.  We will remove the patches on Wednesday and will do our initial read. Then you will come back on Friday for a final read.

## 2024-06-16 ENCOUNTER — Encounter: Admitting: Family

## 2024-06-18 ENCOUNTER — Encounter: Admitting: Family

## 2024-06-20 ENCOUNTER — Encounter: Admitting: Family Medicine

## 2024-07-14 ENCOUNTER — Encounter: Admitting: Family

## 2024-07-16 ENCOUNTER — Encounter: Admitting: Family

## 2024-07-18 ENCOUNTER — Encounter: Admitting: Internal Medicine
